# Patient Record
Sex: Female | Born: 1970 | Race: Black or African American | Hispanic: No | Marital: Married | Smoking: Never smoker
Health system: Southern US, Community
[De-identification: ages and names within clinical notes are randomized; demographics above are authoritative.]

## PROBLEM LIST (undated history)

## (undated) DIAGNOSIS — D649 Anemia, unspecified: Secondary | ICD-10-CM

## (undated) DIAGNOSIS — C801 Malignant (primary) neoplasm, unspecified: Secondary | ICD-10-CM

## (undated) HISTORY — PX: DILATION AND CURETTAGE OF UTERUS: SHX78

## (undated) HISTORY — PX: WISDOM TOOTH EXTRACTION: SHX21

## (undated) HISTORY — PX: ABDOMINAL HYSTERECTOMY: SHX81

---

## 2006-03-15 ENCOUNTER — Ambulatory Visit: Payer: Self-pay

## 2006-05-29 ENCOUNTER — Ambulatory Visit: Payer: Self-pay

## 2007-06-20 ENCOUNTER — Ambulatory Visit: Payer: Self-pay

## 2008-08-06 ENCOUNTER — Ambulatory Visit: Payer: Self-pay

## 2009-09-07 ENCOUNTER — Ambulatory Visit: Payer: Self-pay

## 2010-09-13 ENCOUNTER — Ambulatory Visit: Payer: Self-pay

## 2011-09-19 ENCOUNTER — Ambulatory Visit: Payer: Self-pay | Admitting: Obstetrics and Gynecology

## 2012-09-25 ENCOUNTER — Ambulatory Visit: Payer: Self-pay | Admitting: Obstetrics and Gynecology

## 2013-09-26 ENCOUNTER — Ambulatory Visit: Payer: Self-pay | Admitting: Obstetrics and Gynecology

## 2013-10-08 ENCOUNTER — Ambulatory Visit: Payer: Self-pay | Admitting: Obstetrics and Gynecology

## 2013-12-18 ENCOUNTER — Emergency Department: Payer: Self-pay | Admitting: Emergency Medicine

## 2014-04-06 DIAGNOSIS — N946 Dysmenorrhea, unspecified: Secondary | ICD-10-CM | POA: Insufficient documentation

## 2014-04-23 ENCOUNTER — Ambulatory Visit: Payer: Self-pay | Admitting: Internal Medicine

## 2014-04-23 ENCOUNTER — Ambulatory Visit: Payer: Self-pay | Admitting: Hematology and Oncology

## 2014-05-09 ENCOUNTER — Ambulatory Visit: Payer: Self-pay | Admitting: Internal Medicine

## 2014-05-09 ENCOUNTER — Ambulatory Visit: Payer: Self-pay | Admitting: Hematology and Oncology

## 2014-06-09 ENCOUNTER — Ambulatory Visit: Payer: Self-pay | Admitting: Hematology and Oncology

## 2014-10-07 ENCOUNTER — Ambulatory Visit: Payer: Self-pay | Admitting: Internal Medicine

## 2014-11-06 DIAGNOSIS — D251 Intramural leiomyoma of uterus: Secondary | ICD-10-CM | POA: Insufficient documentation

## 2015-07-23 ENCOUNTER — Ambulatory Visit: Payer: Self-pay | Admitting: Sports Medicine

## 2015-07-23 ENCOUNTER — Encounter: Payer: Self-pay | Admitting: Sports Medicine

## 2015-07-23 ENCOUNTER — Ambulatory Visit (INDEPENDENT_AMBULATORY_CARE_PROVIDER_SITE_OTHER): Payer: 59 | Admitting: Sports Medicine

## 2015-07-23 ENCOUNTER — Ambulatory Visit (INDEPENDENT_AMBULATORY_CARE_PROVIDER_SITE_OTHER): Payer: 59

## 2015-07-23 VITALS — BP 112/68 | HR 63 | Resp 16

## 2015-07-23 DIAGNOSIS — M216X1 Other acquired deformities of right foot: Secondary | ICD-10-CM | POA: Diagnosis not present

## 2015-07-23 DIAGNOSIS — M722 Plantar fascial fibromatosis: Secondary | ICD-10-CM

## 2015-07-23 DIAGNOSIS — M79672 Pain in left foot: Secondary | ICD-10-CM

## 2015-07-23 DIAGNOSIS — M79671 Pain in right foot: Secondary | ICD-10-CM

## 2015-07-23 DIAGNOSIS — M216X2 Other acquired deformities of left foot: Secondary | ICD-10-CM | POA: Diagnosis not present

## 2015-07-23 MED ORDER — TRIAMCINOLONE ACETONIDE 10 MG/ML IJ SUSP: 10.0000 mg | Freq: Once | INTRAMUSCULAR | Status: DC

## 2015-07-23 NOTE — Progress Notes (Signed)
Patient ID: AVYN ADEN, female   DOB: Jul 17, 1971, 44 y.o.   MRN: [id number] Subjective: Sabrina Oneill is a 44 y.o. female patient presents to office with complaint of heel pain on the right heel. 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 Admits to pain 1st few steps out of bed in the morning being the worse. Patient has treated this problem with stretching, icing and Naproxen with some relief . Patient reports that she desires more treatment because heel pain is not completely gone away; States that sometimes after work she gets jolts of pain up the leg. Patient denies any constitutional symptoms or other pedal complaints at this time.   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 There are no active problems to display for this patient.  No current outpatient prescriptions on file prior to visit.   No current facility-administered medications on file prior to visit.   No Known Allergies   ROS: Negative Objective: Physical Exam General: The patient is alert and oriented x3 in no acute distress.  Dermatology: Skin is warm, dry and supple bilateral lower extremities. Nails 1-10 are normal. There is no erythema, edema, no eccymosis, no open lesions present. Integument is otherwise unremarkable.  Vascular: Dorsalis Pedis pulse and Posterior Tibial pulse are 2/4 bilateral. Capillary fill time is immediate to all digits.  Neurological: Grossly intact to light touch with an achilles reflex of +2/5 and a  negative Tinel's sign bilateral.  Musculoskeletal: Tenderness to palpation at the medial calcaneal tubercale and through the insertion of the plantar fascia on the right foot. No pain with compression of calcaneus bilateral. No pain with tuning fork to calcaneus bilateral. No pain with calf compression bilateral. There is decreased Ankle joint range of motion bilateral. All  other joints within normal limits. Strength 5/5 in all groups  Gait: Unassisted, Antalgic avoid weight on right heel  Xray, Right foot 3 views: Posterior and inferior enthesopathy, pes planus, no fracture/dislocation. Soft tissue within normal limits  Assessment and Plan: Problem List Items Addressed This Visit    None    Visit Diagnoses    Right foot pain    -  Primary    Relevant Orders    DG Foot Complete Right    Plantar fasciitis of right foot        Acquired equinus deformity of both feet          -Complete examination performed. Discussed with patient in detail the condition of plantar fasciitis, how this occurs and general treatment options. Explained both conservative and surgical treatments.  -After oral consent and aseptic prep, injected a mixture containing 1 ml of 1%  plain lidocaine, 1 ml 0.25% plain marcaine, 0.5 ml of kenalog 10 and 0.5 ml of dexamethasone phosphate into right heel. Post-injection care discussed with patient.  -Continue with Naproxen as needed  -Recommended good supportive shoes and advised use of OTC insert. - Explained in detail the use of the fascial brace/ night splint which was dispensed at today's visit. -Explained and dispensed to patient daily stretching exercises. -Recommend patient to ice affected area 1-2x daily. -Patient to return to office in 4 weeks for follow up or sooner if problems or questions  Arise.  Landis Martins, DPM

## 2015-07-23 NOTE — Patient Instructions (Signed)
Plantar Fasciitis Plantar fasciitis is a painful foot condition that affects the heel. It occurs when the band of tissue that connects the toes to the heel bone (plantar fascia) becomes irritated. This can happen after exercising too much or doing other repetitive activities (overuse injury). The pain from plantar fasciitis can range from mild irritation to severe pain that makes it difficult for you to walk or move. The pain is usually worse in the morning or after you have been sitting or lying down for a while. CAUSES This condition may be caused by:  Standing for long periods of time.  Wearing shoes that do not fit.  Doing high-impact activities, including running, aerobics, and ballet.  Being overweight.  Having an abnormal way of walking (gait).  Having tight calf muscles.  Having high arches in your feet.  Starting a new athletic activity. SYMPTOMS The main symptom of this condition is heel pain. Other symptoms include:  Pain that gets worse after activity or exercise.  Pain that is worse in the morning or after resting.  Pain that goes away after you walk for a few minutes. DIAGNOSIS This condition may be diagnosed based on your signs and symptoms. Your health care provider will also do a physical exam to check for:  A tender area on the bottom of your foot.  A high arch in your foot.  Pain when you move your foot.  Difficulty moving your foot. You may also need to have imaging studies to confirm the diagnosis. These can include:  X-rays.  Ultrasound.  MRI. TREATMENT  Treatment for plantar fasciitis depends on the severity of the condition. Your treatment may include:  Rest, ice, and over-the-counter pain medicines to manage your pain.  Exercises to stretch your calves and your plantar fascia.  A splint that holds your foot in a stretched, upward position while you sleep (night splint).  Physical therapy to relieve symptoms and prevent problems in the  future.  Cortisone injections to relieve severe pain.  Extracorporeal shock wave therapy (ESWT) to stimulate damaged plantar fascia with electrical impulses. It is often used as a last resort before surgery.  Surgery, if other treatments have not worked after 12 months. HOME CARE INSTRUCTIONS  Take medicines only as directed by your health care provider.  Avoid activities that cause pain.  Roll the bottom of your foot over a bag of ice or a bottle of cold water. Do this for 20 minutes, 3-4 times a day.  Perform simple stretches as directed by your health care provider.  Try wearing athletic shoes with air-sole or gel-sole cushions or soft shoe inserts.  Wear a night splint while sleeping, if directed by your health care provider.  Keep all follow-up appointments with your health care provider. PREVENTION   Do not perform exercises or activities that cause heel pain.  Consider finding low-impact activities if you continue to have problems.  Lose weight if you need to. The best way to prevent plantar fasciitis is to avoid the activities that aggravate your plantar fascia. SEEK MEDICAL CARE IF:  Your symptoms do not go away after treatment with home care measures.  Your pain gets worse.  Your pain affects your ability to move or do your daily activities.   This information is not intended to replace advice given to you by your health care provider. Make sure you discuss any questions you have with your health care provider.   Document Released: 06/20/2001 Document Revised: 06/16/2015 Document Reviewed: 08/05/2014 Elsevier   Interactive Patient Education 2016 [employer]  

## 2015-08-20 ENCOUNTER — Ambulatory Visit: Payer: 59 | Admitting: Sports Medicine

## 2015-09-22 ENCOUNTER — Other Ambulatory Visit: Payer: Self-pay | Admitting: Internal Medicine

## 2015-09-22 DIAGNOSIS — Z1231 Encounter for screening mammogram for malignant neoplasm of breast: Secondary | ICD-10-CM

## 2015-10-12 ENCOUNTER — Ambulatory Visit
Admission: RE | Admit: 2015-10-12 | Discharge: 2015-10-12 | Disposition: A | Discharge status: Home / Self Care | Payer: 59 | Source: Ambulatory Visit | Attending: Internal Medicine | Admitting: Internal Medicine

## 2015-10-12 ENCOUNTER — Other Ambulatory Visit: Payer: Self-pay | Admitting: Internal Medicine

## 2015-10-12 DIAGNOSIS — Z1231 Encounter for screening mammogram for malignant neoplasm of breast: Secondary | ICD-10-CM

## 2015-11-15 DIAGNOSIS — D259 Leiomyoma of uterus, unspecified: Secondary | ICD-10-CM | POA: Diagnosis not present

## 2015-11-15 DIAGNOSIS — Z01419 Encounter for gynecological examination (general) (routine) without abnormal findings: Secondary | ICD-10-CM | POA: Diagnosis not present

## 2015-11-15 DIAGNOSIS — Z1231 Encounter for screening mammogram for malignant neoplasm of breast: Secondary | ICD-10-CM | POA: Diagnosis not present

## 2015-11-15 DIAGNOSIS — Z124 Encounter for screening for malignant neoplasm of cervix: Secondary | ICD-10-CM | POA: Diagnosis not present

## 2016-01-11 DIAGNOSIS — Z13220 Encounter for screening for lipoid disorders: Secondary | ICD-10-CM | POA: Diagnosis not present

## 2016-01-11 DIAGNOSIS — D500 Iron deficiency anemia secondary to blood loss (chronic): Secondary | ICD-10-CM | POA: Diagnosis not present

## 2016-01-11 DIAGNOSIS — Z0000 Encounter for general adult medical examination without abnormal findings: Secondary | ICD-10-CM | POA: Diagnosis not present

## 2016-07-24 DIAGNOSIS — D500 Iron deficiency anemia secondary to blood loss (chronic): Secondary | ICD-10-CM | POA: Diagnosis not present

## 2016-07-24 DIAGNOSIS — L729 Follicular cyst of the skin and subcutaneous tissue, unspecified: Secondary | ICD-10-CM | POA: Diagnosis not present

## 2016-09-11 ENCOUNTER — Other Ambulatory Visit: Payer: Self-pay | Admitting: Internal Medicine

## 2016-09-11 DIAGNOSIS — Z1231 Encounter for screening mammogram for malignant neoplasm of breast: Secondary | ICD-10-CM

## 2016-10-17 ENCOUNTER — Ambulatory Visit
Admission: RE | Admit: 2016-10-17 | Discharge: 2016-10-17 | Disposition: A | Discharge status: Home / Self Care | Payer: 59 | Source: Ambulatory Visit | Attending: Internal Medicine | Admitting: Internal Medicine

## 2016-10-17 DIAGNOSIS — Z1231 Encounter for screening mammogram for malignant neoplasm of breast: Secondary | ICD-10-CM | POA: Insufficient documentation

## 2016-11-30 DIAGNOSIS — D251 Intramural leiomyoma of uterus: Secondary | ICD-10-CM | POA: Diagnosis not present

## 2016-11-30 DIAGNOSIS — Z01411 Encounter for gynecological examination (general) (routine) with abnormal findings: Secondary | ICD-10-CM | POA: Diagnosis not present

## 2017-01-16 DIAGNOSIS — D500 Iron deficiency anemia secondary to blood loss (chronic): Secondary | ICD-10-CM | POA: Diagnosis not present

## 2017-01-16 DIAGNOSIS — L729 Follicular cyst of the skin and subcutaneous tissue, unspecified: Secondary | ICD-10-CM | POA: Diagnosis not present

## 2017-01-16 DIAGNOSIS — Z0000 Encounter for general adult medical examination without abnormal findings: Secondary | ICD-10-CM | POA: Diagnosis not present

## 2017-01-16 DIAGNOSIS — Z131 Encounter for screening for diabetes mellitus: Secondary | ICD-10-CM | POA: Diagnosis not present

## 2017-01-22 DIAGNOSIS — R220 Localized swelling, mass and lump, head: Secondary | ICD-10-CM | POA: Diagnosis not present

## 2017-01-25 DIAGNOSIS — R220 Localized swelling, mass and lump, head: Secondary | ICD-10-CM | POA: Diagnosis not present

## 2017-01-25 DIAGNOSIS — L7211 Pilar cyst: Secondary | ICD-10-CM | POA: Diagnosis not present

## 2017-02-06 DIAGNOSIS — L729 Follicular cyst of the skin and subcutaneous tissue, unspecified: Secondary | ICD-10-CM | POA: Insufficient documentation

## 2017-07-10 DIAGNOSIS — D500 Iron deficiency anemia secondary to blood loss (chronic): Secondary | ICD-10-CM | POA: Diagnosis not present

## 2017-07-11 ENCOUNTER — Other Ambulatory Visit: Payer: Self-pay | Admitting: *Deleted

## 2017-07-11 ENCOUNTER — Inpatient Hospital Stay: Payer: 59 | Attending: Oncology | Admitting: Oncology

## 2017-07-11 ENCOUNTER — Inpatient Hospital Stay: Payer: 59

## 2017-07-11 ENCOUNTER — Other Ambulatory Visit: Payer: Self-pay | Admitting: Oncology

## 2017-07-11 VITALS — BP 123/72 | HR 76 | Temp 97.9°F | Resp 18 | Wt 181.3 lb

## 2017-07-11 DIAGNOSIS — D649 Anemia, unspecified: Secondary | ICD-10-CM

## 2017-07-11 DIAGNOSIS — D500 Iron deficiency anemia secondary to blood loss (chronic): Secondary | ICD-10-CM

## 2017-07-11 DIAGNOSIS — D509 Iron deficiency anemia, unspecified: Secondary | ICD-10-CM | POA: Diagnosis not present

## 2017-07-11 LAB — RETICULOCYTES
RBC.: 3.4 MIL/uL — AB (ref 3.80–5.20)
RETIC COUNT ABSOLUTE: 85 10*3/uL (ref 19.0–183.0)
Retic Ct Pct: 2.5 % (ref 0.4–3.1)

## 2017-07-11 LAB — CBC
HEMATOCRIT: 19 % — AB (ref 35.0–47.0)
HEMOGLOBIN: 5.7 g/dL — AB (ref 12.0–16.0)
MCH: 17.5 pg — ABNORMAL LOW (ref 26.0–34.0)
MCHC: 30.1 g/dL — ABNORMAL LOW (ref 32.0–36.0)
MCV: 58.2 fL — AB (ref 80.0–100.0)
Platelets: 317 10*3/uL (ref 150–440)
RBC: 3.27 MIL/uL — AB (ref 3.80–5.20)
RDW: 24 % — AB (ref 11.5–14.5)
WBC: 4.5 10*3/uL (ref 3.6–11.0)

## 2017-07-11 LAB — FOLATE: FOLATE: 17.5 ng/mL (ref >5.9)

## 2017-07-11 LAB — IRON AND TIBC
IRON: 89 ug/dL (ref 28–170)
Saturation Ratios: 19 % (ref 10.4–31.8)
TIBC: 468 ug/dL — AB (ref 250–450)
UIBC: 379 ug/dL

## 2017-07-11 LAB — SAMPLE TO BLOOD BANK

## 2017-07-11 LAB — DAT, POLYSPECIFIC AHG (ARMC ONLY): POLYSPECIFIC AHG TEST: NEGATIVE

## 2017-07-11 LAB — FERRITIN: FERRITIN: 12 ng/mL (ref 11–307)

## 2017-07-11 LAB — VITAMIN B12: VITAMIN B 12: 649 pg/mL (ref 180–914)

## 2017-07-11 LAB — LACTATE DEHYDROGENASE: LDH: 134 U/L (ref 98–192)

## 2017-07-11 NOTE — Progress Notes (Signed)
Pt has had low hgb/hct counts.  In today as a new patient to see Dr Grayland Ormond.

## 2017-07-12 ENCOUNTER — Other Ambulatory Visit: Payer: Self-pay | Admitting: Oncology

## 2017-07-12 DIAGNOSIS — D500 Iron deficiency anemia secondary to blood loss (chronic): Secondary | ICD-10-CM

## 2017-07-12 LAB — HAPTOGLOBIN: HAPTOGLOBIN: 142 mg/dL (ref 34–200)

## 2017-07-12 LAB — ERYTHROPOIETIN: Erythropoietin: 821 m[IU]/mL — ABNORMAL HIGH (ref 2.6–18.5)

## 2017-07-13 ENCOUNTER — Inpatient Hospital Stay: Payer: 59

## 2017-07-13 NOTE — Progress Notes (Signed)
Sabrina Oneill  Telephone:(336) [phone] [phone]) [phone]  ID: Sabrina Oneill OB: [birth year]  MR#: [id number]  GEX#:[id number]  Patient Care Team: Glendon Axe, MD as PCP - General (Internal Medicine)  CHIEF COMPLAINT: Iron deficiency anemia.  INTERVAL HISTORY: Patient is a 46 year old female who has a long-standing history of severe iron deficiency anemia who is intolerant to oral iron supplementation. She previously received iron infusions, but none for over 3 years. She currently feels well and is asymptomatic. She continues to work full-time. She has no neurologic complaints. She denies any recent fevers or illnesses. She has a good appetite and denies weight loss. She has no chest pain or shortness of breath. She denies any nausea, vomiting, constipation, or diarrhea. She denies any melena or hematochezia. She continues to have heavy menses secondary to fibroids. She has no urinary complaints. Patient feels at her baseline and offers no specific complaints today.  REVIEW OF SYSTEMS:   Review of Systems  Constitutional: Negative.  Negative for fever, malaise/fatigue and weight loss.  Respiratory: Negative.  Negative for cough and shortness of breath.   Cardiovascular: Negative.  Negative for chest pain and leg swelling.  Gastrointestinal: Negative.  Negative for abdominal pain, blood in stool and melena.  Genitourinary: Negative.  Negative for hematuria.  Musculoskeletal: Negative.   Skin: Negative.   Neurological: Negative.  Negative for sensory change and weakness.  Psychiatric/Behavioral: Negative.  The patient is not nervous/anxious.     As per HPI. Otherwise, a complete review of systems is negative.  PAST MEDICAL HISTORY: No past medical history on file.  PAST SURGICAL HISTORY: No past surgical history on file.  FAMILY HISTORY: Family History  Problem Relation Age of Onset  . Breast cancer Paternal Grandmother        in her 28's    ADVANCED  DIRECTIVES (Y/N):  N  HEALTH MAINTENANCE: Social History  Substance Use Topics  . Smoking status: Never Smoker  . Smokeless tobacco: Never Used  . Alcohol use Not on file     Colonoscopy:  PAP:  Bone density:  Lipid panel:  No Known Allergies  Current Outpatient Prescriptions  Medication Sig Dispense Refill  . naproxen sodium (ANAPROX) 550 MG tablet TAKE 1 TABLET BY MOUTH 2 TIMES DAILY.    . Multiple Vitamin (MULTIVITAMIN) capsule Take by mouth.    . naproxen sodium (ANAPROX) 550 MG tablet Take by mouth.     No current facility-administered medications for this visit.     OBJECTIVE: Vitals:   07/11/17 1218  BP: 123/72  Pulse: 76  Resp: 18  Temp: 97.9 F (36.6 C)     There is no height or weight on file to calculate BMI.    ECOG FS:0 - Asymptomatic  General: Well-developed, well-nourished, no acute distress. Eyes: Pink conjunctiva, anicteric sclera. HEENT: Normocephalic, moist mucous membranes, clear oropharnyx. Lungs: Clear to auscultation bilaterally. Heart: Regular rate and rhythm. No rubs, murmurs, or gallops. Abdomen: Soft, nontender, nondistended. No organomegaly noted, normoactive bowel sounds. Musculoskeletal: No edema, cyanosis, or clubbing. Neuro: Alert, answering all questions appropriately. Cranial nerves grossly intact. Skin: No rashes or petechiae noted. Psych: Normal affect. Lymphatics: No cervical, calvicular, axillary or inguinal LAD.   LAB RESULTS:  No results found for: NA, K, CL, CO2, GLUCOSE, BUN, CREATININE, CALCIUM, PROT, ALBUMIN, AST, ALT, ALKPHOS, BILITOT, GFRNONAA, GFRAA  Lab Results  Component Value Date   WBC 4.5 07/11/2017   HGB 5.7 (L) 07/11/2017   HCT 19.0 (L) 07/11/2017   MCV 58.2 (  L) 07/11/2017   PLT 317 07/11/2017   Lab Results  Component Value Date   IRON 89 07/11/2017   TIBC 468 (H) 07/11/2017   IRONPCTSAT 19 07/11/2017   Lab Results  Component Value Date   FERRITIN 12 07/11/2017     STUDIES: No results  found.  ASSESSMENT: Iron deficiency anemia.  PLAN:    1. Iron deficiency anemia: Likely secondary to heavy menstrual bleeding from fibroids. Patient reports she is intolerant to oral iron supplementation. Previously, hemoglobinopathy profile was reported as normal. She has an inappropriately normal reticulocyte count. Her erythropoietin levels are appropriately elevated. The remainder of her laboratory work is either negative or within normal limits. Patient will proceed with 3 infusions of 510 mg Feraheme over the next 3 weeks. If her hemoglobin does not improve, could consider a bone marrow biopsy in the near future. She will return to clinic in 3 months with repeat laboratory work and further evaluation.  Approximately 60 minutes was spent in discussion of which greater than 50% was consultation.   Patient expressed understanding and was in agreement with this plan. She also understands that She can call clinic at any time with any questions, concerns, or complaints.    Lloyd Huger, MD   07/13/2017 9:34 AM

## 2017-07-16 ENCOUNTER — Inpatient Hospital Stay: Payer: 59

## 2017-07-16 VITALS — BP 109/71 | HR 75 | Temp 98.7°F | Resp 16

## 2017-07-16 DIAGNOSIS — D509 Iron deficiency anemia, unspecified: Secondary | ICD-10-CM | POA: Diagnosis not present

## 2017-07-16 DIAGNOSIS — D500 Iron deficiency anemia secondary to blood loss (chronic): Secondary | ICD-10-CM

## 2017-07-16 MED ORDER — SODIUM CHLORIDE 0.9 % IV SOLN
510.0000 mg | Freq: Once | INTRAVENOUS | Status: AC
  Administered 2017-07-16: 510 mg via INTRAVENOUS
  Filled 2017-07-16: qty 17

## 2017-07-16 MED ORDER — METHYLPREDNISOLONE SODIUM SUCC 125 MG IJ SOLR
125.0000 mg | Freq: Once | INTRAMUSCULAR | Status: AC
  Administered 2017-07-16: 125 mg via INTRAVENOUS

## 2017-07-16 MED ORDER — SODIUM CHLORIDE 0.9 % IV SOLN
Freq: Once | INTRAVENOUS | Status: AC
  Administered 2017-07-16: 14:00:00 via INTRAVENOUS
  Filled 2017-07-16: qty 1000

## 2017-07-16 MED ORDER — FAMOTIDINE IN NACL 20-0.9 MG/50ML-% IV SOLN
20.0000 mg | Freq: Once | INTRAVENOUS | Status: AC
  Administered 2017-07-16: 20 mg via INTRAVENOUS

## 2017-07-16 MED ORDER — DIPHENHYDRAMINE HCL 50 MG/ML IJ SOLN
25.0000 mg | Freq: Once | INTRAMUSCULAR | Status: AC
  Administered 2017-07-16: 25 mg via INTRAVENOUS

## 2017-07-16 NOTE — Progress Notes (Signed)
1413: Patient c/o shortness of breath and dizziness.  Infusion stopped at this time.  Dr. Grayland Ormond paged and hypersensitivity medications given.  Symptoms subsided shortly after infusion stopped.  Vitals stable.  1440:  Dr. Grayland Ormond called at this time.  Infusion restarted per MD.  Will monitor patient closely.

## 2017-07-17 DIAGNOSIS — D500 Iron deficiency anemia secondary to blood loss (chronic): Secondary | ICD-10-CM | POA: Diagnosis not present

## 2017-07-23 ENCOUNTER — Inpatient Hospital Stay: Payer: 59

## 2017-07-23 VITALS — BP 115/77 | HR 65 | Temp 96.9°F | Resp 16

## 2017-07-23 DIAGNOSIS — D500 Iron deficiency anemia secondary to blood loss (chronic): Secondary | ICD-10-CM

## 2017-07-23 DIAGNOSIS — Z299 Encounter for prophylactic measures, unspecified: Secondary | ICD-10-CM

## 2017-07-23 DIAGNOSIS — D509 Iron deficiency anemia, unspecified: Secondary | ICD-10-CM | POA: Diagnosis not present

## 2017-07-23 MED ORDER — SODIUM CHLORIDE 0.9 % IV SOLN
510.0000 mg | Freq: Once | INTRAVENOUS | Status: AC
  Administered 2017-07-23: 510 mg via INTRAVENOUS
  Filled 2017-07-23: qty 17

## 2017-07-23 MED ORDER — ACETAMINOPHEN 325 MG PO TABS
650.0000 mg | ORAL_TABLET | Freq: Once | ORAL | Status: AC
  Administered 2017-07-23: 650 mg via ORAL
  Filled 2017-07-23: qty 2

## 2017-07-23 MED ORDER — FAMOTIDINE IN NACL 20-0.9 MG/50ML-% IV SOLN
20.0000 mg | Freq: Once | INTRAVENOUS | Status: AC
  Administered 2017-07-23: 20 mg via INTRAVENOUS
  Filled 2017-07-23: qty 50

## 2017-07-23 MED ORDER — SODIUM CHLORIDE 0.9 % IV SOLN
Freq: Once | INTRAVENOUS | Status: AC
  Administered 2017-07-23: 15:00:00 via INTRAVENOUS
  Filled 2017-07-23: qty 1000

## 2017-07-23 MED ORDER — DIPHENHYDRAMINE HCL 50 MG/ML IJ SOLN
25.0000 mg | Freq: Once | INTRAMUSCULAR | Status: AC
  Administered 2017-07-23: 14:00:00 via INTRAVENOUS
  Filled 2017-07-23: qty 1

## 2017-07-23 NOTE — Progress Notes (Signed)
Feraheme infusion started at this time

## 2017-07-23 NOTE — Progress Notes (Signed)
Patient premedicated with Pepcid 20mg , Benadryl 25mg  and Tylenol 650mg .  Patient tolerated Feraheme infusion well.  No s/s of reaction noted during infusion and vitals remained stable.

## 2017-07-23 NOTE — Progress Notes (Signed)
Patient tolerated Feraheme infusion well

## 2017-07-30 ENCOUNTER — Inpatient Hospital Stay: Payer: 59

## 2017-08-13 ENCOUNTER — Inpatient Hospital Stay: Payer: 59 | Attending: Oncology

## 2017-08-13 VITALS — BP 113/71 | HR 69 | Temp 97.6°F | Resp 18

## 2017-08-13 DIAGNOSIS — D509 Iron deficiency anemia, unspecified: Secondary | ICD-10-CM | POA: Insufficient documentation

## 2017-08-13 DIAGNOSIS — Z79899 Other long term (current) drug therapy: Secondary | ICD-10-CM | POA: Diagnosis not present

## 2017-08-13 DIAGNOSIS — D500 Iron deficiency anemia secondary to blood loss (chronic): Secondary | ICD-10-CM

## 2017-08-13 DIAGNOSIS — D649 Anemia, unspecified: Secondary | ICD-10-CM

## 2017-08-13 MED ORDER — FAMOTIDINE IN NACL 20-0.9 MG/50ML-% IV SOLN
20.0000 mg | Freq: Once | INTRAVENOUS | Status: AC
  Administered 2017-08-13: 20 mg via INTRAVENOUS
  Filled 2017-08-13: qty 50

## 2017-08-13 MED ORDER — DIPHENHYDRAMINE HCL 50 MG/ML IJ SOLN
25.0000 mg | Freq: Once | INTRAMUSCULAR | Status: AC
  Administered 2017-08-13: 25 mg via INTRAVENOUS
  Filled 2017-08-13: qty 1

## 2017-08-13 MED ORDER — ACETAMINOPHEN 325 MG PO TABS
650.0000 mg | ORAL_TABLET | Freq: Once | ORAL | Status: AC
  Administered 2017-08-13: 650 mg via ORAL
  Filled 2017-08-13: qty 2

## 2017-08-13 MED ORDER — SODIUM CHLORIDE 0.9 % IV SOLN
510.0000 mg | Freq: Once | INTRAVENOUS | Status: AC
  Administered 2017-08-13: 510 mg via INTRAVENOUS
  Filled 2017-08-13: qty 17

## 2017-08-13 MED ORDER — SODIUM CHLORIDE 0.9 % IV SOLN
Freq: Once | INTRAVENOUS | Status: AC
  Administered 2017-08-13: 14:00:00 via INTRAVENOUS
  Filled 2017-08-13: qty 1000

## 2017-10-12 ENCOUNTER — Other Ambulatory Visit: Payer: Self-pay

## 2017-10-12 DIAGNOSIS — D500 Iron deficiency anemia secondary to blood loss (chronic): Secondary | ICD-10-CM

## 2017-10-15 ENCOUNTER — Inpatient Hospital Stay: Payer: 59

## 2017-10-15 ENCOUNTER — Inpatient Hospital Stay: Payer: 59 | Admitting: Oncology

## 2017-10-17 ENCOUNTER — Other Ambulatory Visit: Payer: Self-pay | Admitting: Internal Medicine

## 2017-10-17 DIAGNOSIS — Z1231 Encounter for screening mammogram for malignant neoplasm of breast: Secondary | ICD-10-CM

## 2017-10-23 ENCOUNTER — Ambulatory Visit
Admission: RE | Admit: 2017-10-23 | Discharge: 2017-10-23 | Disposition: A | Discharge status: Home / Self Care | Payer: 59 | Source: Ambulatory Visit | Attending: Internal Medicine | Admitting: Internal Medicine

## 2017-10-23 DIAGNOSIS — Z1231 Encounter for screening mammogram for malignant neoplasm of breast: Secondary | ICD-10-CM | POA: Diagnosis not present

## 2017-10-28 NOTE — Progress Notes (Deleted)
[place]  Telephone:(336) [phone] [phone]) [phone]  ID: Sabrina Oneill OB: [birth year]  MR#: [id number]  PRF#:[id number]  Patient Care Team: Glendon Axe, MD as PCP - General (Internal Medicine)  CHIEF COMPLAINT: Iron deficiency anemia.  INTERVAL HISTORY: Patient is a 47 year old female who has a long-standing history of severe iron deficiency anemia who is intolerant to oral iron supplementation. She previously received iron infusions, but none for over 3 years. She currently feels well and is asymptomatic. She continues to work full-time. She has no neurologic complaints. She denies any recent fevers or illnesses. She has a good appetite and denies weight loss. She has no chest pain or shortness of breath. She denies any nausea, vomiting, constipation, or diarrhea. She denies any melena or hematochezia. She continues to have heavy menses secondary to fibroids. She has no urinary complaints. Patient feels at her baseline and offers no specific complaints today.  REVIEW OF SYSTEMS:   Review of Systems  Constitutional: Negative.  Negative for fever, malaise/fatigue and weight loss.  Respiratory: Negative.  Negative for cough and shortness of breath.   Cardiovascular: Negative.  Negative for chest pain and leg swelling.  Gastrointestinal: Negative.  Negative for abdominal pain, blood in stool and melena.  Genitourinary: Negative.  Negative for hematuria.  Musculoskeletal: Negative.   Skin: Negative.   Neurological: Negative.  Negative for sensory change and weakness.  Psychiatric/Behavioral: Negative.  The patient is not nervous/anxious.     As per HPI. Otherwise, a complete review of systems is negative.  PAST MEDICAL HISTORY: No past medical history on file.  PAST SURGICAL HISTORY: No past surgical history on file.  FAMILY HISTORY: Family History  Problem Relation Age of Onset  . Breast cancer Paternal Grandmother        in her 21's    ADVANCED  DIRECTIVES (Y/N):  N  HEALTH MAINTENANCE: Social History   Tobacco Use  . Smoking status: Never Smoker  . Smokeless tobacco: Never Used  Substance Use Topics  . Alcohol use: Not on file  . Drug use: Not on file     Colonoscopy:  PAP:  Bone density:  Lipid panel:  No Known Allergies  Current Outpatient Medications  Medication Sig Dispense Refill  . Multiple Vitamin (MULTIVITAMIN) capsule Take by mouth.    . naproxen sodium (ANAPROX) 550 MG tablet Take by mouth.    . naproxen sodium (ANAPROX) 550 MG tablet TAKE 1 TABLET BY MOUTH 2 TIMES DAILY.     No current facility-administered medications for this visit.     OBJECTIVE: There were no vitals filed for this visit.   There is no height or weight on file to calculate BMI.    ECOG FS:0 - Asymptomatic  General: Well-developed, well-nourished, no acute distress. Eyes: Pink conjunctiva, anicteric sclera. HEENT: Normocephalic, moist mucous membranes, clear oropharnyx. Lungs: Clear to auscultation bilaterally. Heart: Regular rate and rhythm. No rubs, murmurs, or gallops. Abdomen: Soft, nontender, nondistended. No organomegaly noted, normoactive bowel sounds. Musculoskeletal: No edema, cyanosis, or clubbing. Neuro: Alert, answering all questions appropriately. Cranial nerves grossly intact. Skin: No rashes or petechiae noted. Psych: Normal affect. Lymphatics: No cervical, calvicular, axillary or inguinal LAD.   LAB RESULTS:  No results found for: NA, K, CL, CO2, GLUCOSE, BUN, CREATININE, CALCIUM, PROT, ALBUMIN, AST, ALT, ALKPHOS, BILITOT, GFRNONAA, GFRAA  Lab Results  Component Value Date   WBC 4.5 07/11/2017   HGB 5.7 (L) 07/11/2017   HCT 19.0 (L) 07/11/2017   MCV 58.2 (L) 07/11/2017  PLT 317 07/11/2017   Lab Results  Component Value Date   IRON 89 07/11/2017   TIBC 468 (H) 07/11/2017   IRONPCTSAT 19 07/11/2017   Lab Results  Component Value Date   FERRITIN 12 07/11/2017     STUDIES: Mm Screening Breast  Tomo Bilateral  Result Date: 10/23/2017 CLINICAL DATA:  Screening. EXAM: 2D DIGITAL SCREENING BILATERAL MAMMOGRAM WITH 3D TOMO WITH CAD COMPARISON:  Previous exam(s). ACR Breast Density Category c: The breast tissue is heterogeneously dense, which may obscure small masses. FINDINGS: There are no findings suspicious for malignancy. Images were processed with CAD. IMPRESSION: No mammographic evidence of malignancy. A result letter of this screening mammogram will be mailed directly to the patient. RECOMMENDATION: Screening mammogram in one year. (Code:SM-B-01Y) BI-RADS CATEGORY  1: Negative. Electronically Signed   By: Margarette Canada M.D.   On: 10/23/2017 13:19    ASSESSMENT: Iron deficiency anemia.  PLAN:    1. Iron deficiency anemia: Likely secondary to heavy menstrual bleeding from fibroids. Patient reports she is intolerant to oral iron supplementation. Previously, hemoglobinopathy profile was reported as normal. She has an inappropriately normal reticulocyte count. Her erythropoietin levels are appropriately elevated. The remainder of her laboratory work is either negative or within normal limits. Patient will proceed with 3 infusions of 510 mg Feraheme over the next 3 weeks. If her hemoglobin does not improve, could consider a bone marrow biopsy in the near future. She will return to clinic in 3 months with repeat laboratory work and further evaluation.  Approximately 60 minutes was spent in discussion of which greater than 50% was consultation.   Patient expressed understanding and was in agreement with this plan. She also understands that She can call clinic at any time with any questions, concerns, or complaints.    Lloyd Huger, MD   10/28/2017 11:29 PM

## 2017-10-29 ENCOUNTER — Inpatient Hospital Stay: Payer: 59

## 2017-10-29 ENCOUNTER — Inpatient Hospital Stay: Payer: 59 | Admitting: Oncology

## 2018-01-08 DIAGNOSIS — Z01411 Encounter for gynecological examination (general) (routine) with abnormal findings: Secondary | ICD-10-CM | POA: Diagnosis not present

## 2018-01-08 DIAGNOSIS — Z86018 Personal history of other benign neoplasm: Secondary | ICD-10-CM | POA: Diagnosis not present

## 2018-01-15 DIAGNOSIS — D500 Iron deficiency anemia secondary to blood loss (chronic): Secondary | ICD-10-CM | POA: Diagnosis not present

## 2018-01-22 DIAGNOSIS — D500 Iron deficiency anemia secondary to blood loss (chronic): Secondary | ICD-10-CM | POA: Diagnosis not present

## 2018-01-22 DIAGNOSIS — Z0000 Encounter for general adult medical examination without abnormal findings: Secondary | ICD-10-CM | POA: Diagnosis not present

## 2018-03-12 ENCOUNTER — Encounter: Payer: 59 | Attending: Internal Medicine | Admitting: Dietician

## 2018-03-12 ENCOUNTER — Encounter: Payer: Self-pay | Admitting: Dietician

## 2018-03-12 DIAGNOSIS — Z713 Dietary counseling and surveillance: Secondary | ICD-10-CM | POA: Insufficient documentation

## 2018-03-12 NOTE — Patient Instructions (Signed)
Limit ginger ale to 3 cups daily; a decrease from 4 cups daily. Decrease cranberry juice from 4 cups to 3 cups. Increase water from 4 cups to 5 cups daily. Include more salmon. Be mindful of the balance of protein, carbohydrate (starch, fruit) and non-starchy vegetables.

## 2018-03-12 NOTE — Progress Notes (Signed)
Self-Referral for Nutrition Counseling: Time with patient: 1430-1500  Diet/Weight  History: Patient reports she has been trying to limit carbs in past few weeks. She gives a highest weight of 187 lbs. Her weight today was 184.6 lbs. She has a high intake of sweetened beverages; drinks at least 1 quart of Ginger-ale daily and 1 quart of cranberry juice on 3-4 days per week. Present diet is low in fruits, vegetables and whole grains.  Typical Eating Pattern: 10:00 oatmeal or 2 eggs with butter, 4 cups coffee 1-2:00pm- Ramen noodles with toast or pizza, Ginger ale 6:00pm- baked sweet potato, beef, green beans or chicken, broccoli, sweet potatoes,Ginger ale Typically no snack after dinner  Instruction/Discussion: Used food guide plate and food models to discuss  food group servings needed to meet basic nutrition needs. Also, discussed how to better balance carbohydrate foods, protein and non-starchy vegetables.  Education/Resources: Plate planner Planning a Balanced Meal Plan: Limit ginger ale to 3 cups daily; a decrease from 4 cups daily. Decrease cranberry juice from 4 cups to 3 cups. Increase water from 4 cups to 5 cups daily. Include more salmon. Be mindful of the balance of protein, carbohydrate (starch, fruit) and non-starchy vegetables.  Follow-up:  04/02/18 at 3:00pm

## 2018-04-02 ENCOUNTER — Encounter: Payer: 59 | Admitting: Dietician

## 2018-04-02 ENCOUNTER — Encounter: Payer: Self-pay | Admitting: Dietician

## 2018-04-02 VITALS — Wt 181.2 lb

## 2018-04-02 DIAGNOSIS — Z713 Dietary counseling and surveillance: Secondary | ICD-10-CM

## 2018-04-02 NOTE — Patient Instructions (Signed)
Increase intake of fruits/vegetables to 3 servings daily. Continue with exercise of of walking 15 minutes at work and 30 minutes at home. Continue to work to increase water.

## 2018-04-02 NOTE — Progress Notes (Signed)
Self-Referral for Nutrition Counseling: 2 of 3 visits Time with patient: 1219-7588  Progress: Patient reports she has followed through to decrease Ginger ale and cranberry juice by 1 cup each. She is also including salmon 2 x per week which was a goal of hers. She has not increased water intake. She is walking at work break for 15 minutes and walks at home for 30 minutes when weather allows. Her weight today is 3.4 lbs less than her weight 3 weeks ago. Diet Recall: 9:30- oatmeal with almond milk, coffee (4 cups total) 11:30- apple with peanut butter, juice 3:00pm- frozen dinner (ww pasta and beans), juice Snack brought in by co-worker- strawberry short cake Instructions/Discussion: Commended on changes made and discussed how small changes in diet and exercise can make a difference and promote weight loss. Reviewed her present meal pattern/choices and asked her to set goals based on our discussion.  Plan: Increase intake of fruits/vegetables to 3 servings daily. Continue with exercise of of walking 15 minutes at work and 30 minutes at home. Continue to work to increase water. Follow-up: 05/07/18 at 3:00pm.

## 2018-05-07 ENCOUNTER — Ambulatory Visit: Payer: 59 | Admitting: Dietician

## 2018-05-14 ENCOUNTER — Ambulatory Visit: Payer: 59 | Admitting: Dietician

## 2018-05-27 ENCOUNTER — Encounter: Payer: 59 | Attending: Internal Medicine | Admitting: Dietician

## 2018-05-27 VITALS — Wt 183.8 lb

## 2018-05-27 DIAGNOSIS — Z713 Dietary counseling and surveillance: Secondary | ICD-10-CM | POA: Insufficient documentation

## 2018-05-27 NOTE — Patient Instructions (Signed)
Refer to calcium list with goal of 1000 mg/ day. Continue to increase water intake and limit sweetened beverages to no more than 2 cups per day. Read labels for saturated fat content with goal of no more than 12 gms daily. Limit eggs to 4 per week. Continue with goal of at least 5 servings of fruits/vegetables daily. 1/2 cup of cooked vegetables counts as a serving.

## 2018-05-27 NOTE — Progress Notes (Signed)
Self-Referral for Nutrition Counseling: 3 of 3 visits Time with patient: 1450-1520  Weight: 183.8 lbs  Progress: Patient reports that she continues to limit sweetened beverages to 2 cups daily and has increased her water intake. She is eating 3 meals daily and 2-3 snacks. She has increased her fruit intake to 1-2 servings several days per week. She is lactose intolerant and stated she does not tolerate Lactaid milk. Reviewed lipid profile with patient from 01/2018. LDL had increased from 115 to 150 since 01/2017. Instructions/Discussion: Commended on the positive diet changes made. Gave and reviewed list of foods and calcium content and instructed on daily recommendations. Reviewed food groups and minimum servings needed to meet basic nutrient needs. Instructed on recommendation for saturated fat and ways to decrease in the diet. Plan:  Refer to calcium list with goal of 1000 mg/ day. Continue to increase water intake and limit sweetened beverages to no more than 2 cups per day. Read labels for saturated fat content with goal of no more than 12 gms daily. Limit eggs to 4 per week. Continue with goal of at least 5 servings of fruits/vegetables daily. 1/2 cup of cooked vegetables counts as a serving.  Follow-up: none scheduled. Patient was encouraged to call if has further questions regarding her diet/nutrition.

## 2018-09-12 DIAGNOSIS — K5909 Other constipation: Secondary | ICD-10-CM | POA: Diagnosis not present

## 2018-10-10 ENCOUNTER — Other Ambulatory Visit: Payer: Self-pay | Admitting: Internal Medicine

## 2018-10-10 DIAGNOSIS — Z1231 Encounter for screening mammogram for malignant neoplasm of breast: Secondary | ICD-10-CM

## 2018-10-30 ENCOUNTER — Ambulatory Visit
Admission: RE | Admit: 2018-10-30 | Discharge: 2018-10-30 | Disposition: A | Discharge status: Home / Self Care | Payer: 59 | Source: Ambulatory Visit | Attending: Internal Medicine | Admitting: Internal Medicine

## 2018-10-30 DIAGNOSIS — Z1231 Encounter for screening mammogram for malignant neoplasm of breast: Secondary | ICD-10-CM | POA: Insufficient documentation

## 2018-11-04 ENCOUNTER — Other Ambulatory Visit: Payer: Self-pay | Admitting: Internal Medicine

## 2018-11-04 DIAGNOSIS — R921 Mammographic calcification found on diagnostic imaging of breast: Secondary | ICD-10-CM

## 2018-11-04 DIAGNOSIS — R928 Other abnormal and inconclusive findings on diagnostic imaging of breast: Secondary | ICD-10-CM

## 2018-11-08 ENCOUNTER — Ambulatory Visit
Admission: RE | Admit: 2018-11-08 | Discharge: 2018-11-08 | Disposition: A | Discharge status: Home / Self Care | Payer: 59 | Source: Ambulatory Visit | Attending: Internal Medicine | Admitting: Internal Medicine

## 2018-11-08 DIAGNOSIS — R921 Mammographic calcification found on diagnostic imaging of breast: Secondary | ICD-10-CM | POA: Diagnosis not present

## 2018-11-08 DIAGNOSIS — R928 Other abnormal and inconclusive findings on diagnostic imaging of breast: Secondary | ICD-10-CM

## 2018-11-12 ENCOUNTER — Other Ambulatory Visit: Payer: Self-pay | Admitting: Internal Medicine

## 2018-11-12 DIAGNOSIS — Z1231 Encounter for screening mammogram for malignant neoplasm of breast: Secondary | ICD-10-CM

## 2019-04-08 DIAGNOSIS — N852 Hypertrophy of uterus: Secondary | ICD-10-CM | POA: Diagnosis not present

## 2019-04-08 DIAGNOSIS — R921 Mammographic calcification found on diagnostic imaging of breast: Secondary | ICD-10-CM | POA: Diagnosis not present

## 2019-04-08 DIAGNOSIS — Z01419 Encounter for gynecological examination (general) (routine) without abnormal findings: Secondary | ICD-10-CM | POA: Diagnosis not present

## 2019-04-08 DIAGNOSIS — Z86018 Personal history of other benign neoplasm: Secondary | ICD-10-CM | POA: Diagnosis not present

## 2019-05-13 ENCOUNTER — Other Ambulatory Visit: Payer: Self-pay | Admitting: Obstetrics and Gynecology

## 2019-05-13 ENCOUNTER — Other Ambulatory Visit: Payer: Self-pay | Admitting: Internal Medicine

## 2019-05-13 ENCOUNTER — Ambulatory Visit
Admission: RE | Admit: 2019-05-13 | Discharge: 2019-05-13 | Disposition: A | Discharge status: Home / Self Care | Payer: 59 | Source: Ambulatory Visit | Attending: Internal Medicine | Admitting: Internal Medicine

## 2019-05-13 ENCOUNTER — Ambulatory Visit
Admission: RE | Admit: 2019-05-13 | Discharge: 2019-05-13 | Disposition: A | Discharge status: Home / Self Care | Payer: 59 | Source: Ambulatory Visit | Attending: Obstetrics and Gynecology | Admitting: Obstetrics and Gynecology

## 2019-05-13 ENCOUNTER — Other Ambulatory Visit: Payer: Self-pay

## 2019-05-13 DIAGNOSIS — Z1231 Encounter for screening mammogram for malignant neoplasm of breast: Secondary | ICD-10-CM | POA: Diagnosis not present

## 2019-05-13 DIAGNOSIS — N644 Mastodynia: Secondary | ICD-10-CM

## 2019-05-13 DIAGNOSIS — N6310 Unspecified lump in the right breast, unspecified quadrant: Secondary | ICD-10-CM

## 2019-05-13 DIAGNOSIS — R922 Inconclusive mammogram: Secondary | ICD-10-CM | POA: Diagnosis not present

## 2019-05-13 DIAGNOSIS — N6489 Other specified disorders of breast: Secondary | ICD-10-CM | POA: Diagnosis not present

## 2019-05-20 DIAGNOSIS — E7800 Pure hypercholesterolemia, unspecified: Secondary | ICD-10-CM | POA: Diagnosis not present

## 2019-05-20 DIAGNOSIS — D500 Iron deficiency anemia secondary to blood loss (chronic): Secondary | ICD-10-CM | POA: Diagnosis not present

## 2019-05-20 DIAGNOSIS — Z79899 Other long term (current) drug therapy: Secondary | ICD-10-CM | POA: Diagnosis not present

## 2019-05-20 DIAGNOSIS — Z0000 Encounter for general adult medical examination without abnormal findings: Secondary | ICD-10-CM | POA: Diagnosis not present

## 2019-05-20 DIAGNOSIS — Z131 Encounter for screening for diabetes mellitus: Secondary | ICD-10-CM | POA: Diagnosis not present

## 2019-05-23 NOTE — Progress Notes (Signed)
[place]  Telephone:(336) [phone] [phone]) [phone]  ID: Sabrina Oneill OB: 04-18-1971  MR#: [id number]  CSN#:[id number]  Patient Care Team: Derinda Late, MD as PCP - General (Family Medicine)  CHIEF COMPLAINT: Iron deficiency anemia.  INTERVAL HISTORY: Patient last evaluated in October 2018.  Patient recently was in the emergency room after being found to have a hemoglobin of 4.8.  She received 1 unit of packed red blood cells at that time.  Currently, patient states her weakness and fatigue have significantly improved.  She continues to have heavy menses and plans to discuss treatment options with OB/GYN.  She continues to remain active and work full-time. She has no neurologic complaints. She denies any recent fevers or illnesses. She has a good appetite and denies weight loss.  She denies any chest pain, shortness of breath, cough, or hemoptysis.  She denies any nausea, vomiting, constipation, or diarrhea. She denies any melena or hematochezia.  She has no urinary complaints.  Patient offers no further specific complaints today.  REVIEW OF SYSTEMS:   Review of Systems  Constitutional: Negative.  Negative for fever, malaise/fatigue and weight loss.  Respiratory: Negative.  Negative for cough and shortness of breath.   Cardiovascular: Negative.  Negative for chest pain and leg swelling.  Gastrointestinal: Negative.  Negative for abdominal pain, blood in stool and melena.  Genitourinary: Negative.  Negative for hematuria.  Musculoskeletal: Negative.  Negative for back pain.  Skin: Negative.  Negative for rash.  Neurological: Negative.  Negative for dizziness, sensory change, focal weakness and weakness.  Psychiatric/Behavioral: Negative.  The patient is not nervous/anxious.     As per HPI. Otherwise, a complete review of systems is negative.  PAST MEDICAL HISTORY: Past Medical History:  Diagnosis Date   Anemia     PAST SURGICAL HISTORY: Past Surgical  History:  Procedure Laterality Date   CESAREAN SECTION      FAMILY HISTORY: Family History  Problem Relation Age of Onset   Breast cancer Paternal Grandmother        in her 4's    ADVANCED DIRECTIVES (Y/N):  N  HEALTH MAINTENANCE: Social History   Tobacco Use   Smoking status: Never Smoker   Smokeless tobacco: Never Used  Substance Use Topics   Alcohol use: Not Currently    Alcohol/week: 0.0 standard drinks   Drug use: Not Currently     Colonoscopy:  PAP:  Bone density:  Lipid panel:  No Known Allergies  Current Outpatient Medications  Medication Sig Dispense Refill   Multiple Vitamin (MULTIVITAMIN) capsule Take 1 capsule by mouth daily.      Multiple Vitamins-Minerals (EMERGEN-C IMMUNE PO) Take 1 packet by mouth daily.     naproxen sodium (ANAPROX) 550 MG tablet Take 550 mg by mouth 2 (two) times daily as needed.      UNABLE TO FIND Take 85 mg by mouth daily. Hema-plex 85 mg     No current facility-administered medications for this visit.     OBJECTIVE: Vitals:   05/30/19 1004  BP: 110/64  Pulse: 82  Resp: 18  Temp: 99.7 F (37.6 C)     There is no height or weight on file to calculate BMI.    ECOG FS:0 - Asymptomatic  General: Well-developed, well-nourished, no acute distress. Eyes: Pink conjunctiva, anicteric sclera. HEENT: Normocephalic, moist mucous membranes, clear oropharnyx. Lungs: Clear to auscultation bilaterally. Heart: Regular rate and rhythm. No rubs, murmurs, or gallops. Abdomen: Soft, nontender, nondistended. No organomegaly noted, normoactive bowel sounds.  Musculoskeletal: No edema, cyanosis, or clubbing. Neuro: Alert, answering all questions appropriately. Cranial nerves grossly intact. Skin: No rashes or petechiae noted. Psych: Normal affect. Lymphatics: No cervical, calvicular, axillary or inguinal LAD.   LAB RESULTS:  Lab Results  Component Value Date   NA 139 05/24/2019   K 3.4 (L) 05/24/2019   CL 107 05/24/2019     CO2 23 05/24/2019   GLUCOSE 85 05/24/2019   BUN 5 (L) 05/24/2019   CREATININE 0.77 05/24/2019   CALCIUM 8.7 (L) 05/24/2019   PROT 6.7 05/24/2019   ALBUMIN 3.6 05/24/2019   AST 17 05/24/2019   ALT 13 05/24/2019   ALKPHOS 50 05/24/2019   BILITOT 0.4 05/24/2019   GFRNONAA >60 05/24/2019   GFRAA >60 05/24/2019    Lab Results  Component Value Date   WBC 4.1 05/30/2019   NEUTROABS 2.8 05/30/2019   HGB 6.9 (L) 05/30/2019   HCT 24.8 (L) 05/30/2019   MCV 68.7 (L) 05/30/2019   PLT 473 (H) 05/30/2019   Lab Results  Component Value Date   IRON 12 (L) 05/30/2019   TIBC 438 05/30/2019   IRONPCTSAT 3 (L) 05/30/2019   Lab Results  Component Value Date   FERRITIN 5 (L) 05/30/2019     STUDIES: US Breast Ltd Uni Left Inc Axilla  Result Date: 05/13/2019 CLINICAL DATA:  Six-month follow-up of probably benign right breast calcifications. New focal pain in the lateral left breast. EXAM: DIGITAL DIAGNOSTIC BILATERAL MAMMOGRAM WITH CAD AND TOMO ULTRASOUND LEFT BREAST COMPARISON:  Previous exam(s). ACR Breast Density Category c: The breast tissue is heterogeneously dense, which may obscure small masses. FINDINGS: The 2 groups of calcifications in the lateral right breast are stable. Again noted is suggestion of some layering. No other suspicious findings seen in either breast. An asymmetry in the left breast on the MLO view resolves on spot imaging. Mammographic images were processed with CAD. On physical exam, no suspicious lumps are identified. Targeted ultrasound is performed, showing no abnormalities in the region of the patient's pain. IMPRESSION: Probably benign right breast calcifications, stable. No other abnormalities identified. RECOMMENDATION: Six-month follow-up mammography of the right breast calcifications. Treatment of the patient's symptoms on the left should be based on clinical and physical exam given lack of imaging findings. I have discussed the findings and recommendations with  the patient. Results were also provided in writing at the conclusion of the visit. If applicable, a reminder letter will be sent to the patient regarding the next appointment. BI-RADS CATEGORY  3: Probably benign. Electronically Signed   By: Dorise Bullion III M.D   On: 05/13/2019 15:52   Mm Diag Breast Tomo Bilateral  Result Date: 05/13/2019 CLINICAL DATA:  Six-month follow-up of probably benign right breast calcifications. New focal pain in the lateral left breast. EXAM: DIGITAL DIAGNOSTIC BILATERAL MAMMOGRAM WITH CAD AND TOMO ULTRASOUND LEFT BREAST COMPARISON:  Previous exam(s). ACR Breast Density Category c: The breast tissue is heterogeneously dense, which may obscure small masses. FINDINGS: The 2 groups of calcifications in the lateral right breast are stable. Again noted is suggestion of some layering. No other suspicious findings seen in either breast. An asymmetry in the left breast on the MLO view resolves on spot imaging. Mammographic images were processed with CAD. On physical exam, no suspicious lumps are identified. Targeted ultrasound is performed, showing no abnormalities in the region of the patient's pain. IMPRESSION: Probably benign right breast calcifications, stable. No other abnormalities identified. RECOMMENDATION: Six-month follow-up mammography of the right breast calcifications.  Treatment of the patient's symptoms on the left should be based on clinical and physical exam given lack of imaging findings. I have discussed the findings and recommendations with the patient. Results were also provided in writing at the conclusion of the visit. If applicable, a reminder letter will be sent to the patient regarding the next appointment. BI-RADS CATEGORY  3: Probably benign. Electronically Signed   By: Dorise Bullion III M.D   On: 05/13/2019 15:52    ASSESSMENT: Iron deficiency anemia.  PLAN:    1. Iron deficiency anemia: Secondary to heavy menstrual bleeding from fibroids.  Patient is  intolerant to oral iron supplementation.  Although her hemoglobin is still significantly decreased, it is much improved since receiving 1 unit of packed red blood cells in the emergency room.  Her iron stores are also significantly reduced.  Previously, all the remainder laboratory work including hemoglobinopathy profile was either negative or within normal limits.  Return to clinic in 1 and 2 weeks for 510 mg IV Feraheme.  Patient will then return to clinic in 2 months with repeat laboratory work, further evaluation, and consideration of additional treatment.   I spent a total of 30 minutes face-to-face with the patient of which greater than 50% of the visit was spent in counseling and coordination of care as detailed above.   Patient expressed understanding and was in agreement with this plan. She also understands that She can call clinic at any time with any questions, concerns, or complaints.    Lloyd Huger, MD   05/30/2019 3:44 PM

## 2019-05-24 ENCOUNTER — Encounter: Payer: Self-pay | Admitting: Emergency Medicine

## 2019-05-24 ENCOUNTER — Emergency Department
Admission: EM | Admit: 2019-05-24 | Discharge: 2019-05-24 | Disposition: A | Discharge status: Home / Self Care | Payer: 59 | Attending: Emergency Medicine | Admitting: Emergency Medicine

## 2019-05-24 ENCOUNTER — Other Ambulatory Visit: Payer: Self-pay

## 2019-05-24 DIAGNOSIS — D649 Anemia, unspecified: Secondary | ICD-10-CM | POA: Insufficient documentation

## 2019-05-24 DIAGNOSIS — Z79899 Other long term (current) drug therapy: Secondary | ICD-10-CM | POA: Diagnosis not present

## 2019-05-24 HISTORY — DX: Anemia, unspecified: D64.9

## 2019-05-24 LAB — COMPREHENSIVE METABOLIC PANEL
ALT: 13 U/L (ref 0–44)
AST: 17 U/L (ref 15–41)
Albumin: 3.6 g/dL (ref 3.5–5.0)
Alkaline Phosphatase: 50 U/L (ref 38–126)
Anion gap: 9 (ref 5–15)
BUN: 5 mg/dL — ABNORMAL LOW (ref 6–20)
CO2: 23 mmol/L (ref 22–32)
Calcium: 8.7 mg/dL — ABNORMAL LOW (ref 8.9–10.3)
Chloride: 107 mmol/L (ref 98–111)
Creatinine, Ser: 0.77 mg/dL (ref 0.44–1.00)
GFR calc Af Amer: >60 mL/min (ref >60)
GFR calc non Af Amer: >60 mL/min (ref >60)
Glucose, Bld: 85 mg/dL (ref 70–99)
Potassium: 3.4 mmol/L — ABNORMAL LOW (ref 3.5–5.1)
Sodium: 139 mmol/L (ref 135–145)
Total Bilirubin: 0.4 mg/dL (ref 0.3–1.2)
Total Protein: 6.7 g/dL (ref 6.5–8.1)

## 2019-05-24 LAB — CBC
HCT: 18.6 % — ABNORMAL LOW (ref 36.0–46.0)
Hemoglobin: 4.8 g/dL — CL (ref 12.0–15.0)
MCH: 17.3 pg — ABNORMAL LOW (ref 26.0–34.0)
MCHC: 25.8 g/dL — ABNORMAL LOW (ref 30.0–36.0)
MCV: 67.1 fL — ABNORMAL LOW (ref 80.0–100.0)
Platelets: 266 10*3/uL (ref 150–400)
RBC: 2.77 MIL/uL — ABNORMAL LOW (ref 3.87–5.11)
RDW: 34.6 % — ABNORMAL HIGH (ref 11.5–15.5)
WBC: 4.1 10*3/uL (ref 4.0–10.5)
nRBC: 0 % (ref 0.0–0.2)

## 2019-05-24 LAB — PREPARE RBC (CROSSMATCH)

## 2019-05-24 LAB — ABO/RH: ABO/RH(D): B POS

## 2019-05-24 MED ORDER — SODIUM CHLORIDE 0.9 % IV SOLN
10.0000 mL/h | Freq: Once | INTRAVENOUS | Status: AC
Start: 2019-05-24 — End: 2019-05-24
  Administered 2019-05-24: 10 mL/h via INTRAVENOUS

## 2019-05-24 NOTE — ED Notes (Signed)
Transfusion rate change to 175 ml/hr

## 2019-05-24 NOTE — ED Provider Notes (Addendum)
[facility] Emergency Department Provider Note   ____________________________________________    I have reviewed the triage vital signs and the nursing notes.   HISTORY  Chief Complaint Abnormal Lab     HPI Sabrina Oneill is a 48 y.o. female who is sent to the emergency department for low hemoglobin.  Patient reportedly had PCP visit and was found to have hemoglobin of 4.6.  Patient has a long history of iron deficiency anemia.  Also reports a history of fibroids, recently completed menstrual cycle.  She does describe some dizziness occasionally.  In 2018 patient received iron infusions but reports that she has not been following up with hematology since then however recently she is scheduled appointment with Dr. Grayland Ormond of hematology as well as OB/GYN  Past Medical History:  Diagnosis Date  . Anemia     Patient Active Problem List   Diagnosis Date Noted  . Iron deficiency anemia due to chronic blood loss 07/12/2017    Past Surgical History:  Procedure Laterality Date  . CESAREAN SECTION      Prior to Admission medications   Medication Sig Start Date End Date Taking? Authorizing Provider  Multiple Vitamin (MULTIVITAMIN) capsule Take 1 capsule by mouth daily.    Yes [provider]  Multiple Vitamins-Minerals (EMERGEN-C IMMUNE PO) Take 1 packet by mouth daily.   Yes [provider]  naproxen sodium (ANAPROX) 550 MG tablet Take 550 mg by mouth 2 (two) times daily as needed.  07/06/17  Yes [provider]  UNABLE TO FIND Take 85 mg by mouth daily. Hema-plex 85 mg   Yes [provider]     Allergies Patient has no known allergies.  Family History  Problem Relation Age of Onset  . Breast cancer Paternal Grandmother        in her 55's    Social History Social History   Tobacco Use  . Smoking status: Never Smoker  . Smokeless tobacco: Never Used  Substance Use Topics  . Alcohol use: Not Currently    Alcohol/week: 0.0 standard drinks  . Drug use: Not Currently    Review of Systems  Constitutional: Dizziness as above Eyes: No visual changes.  ENT: No sore throat. Cardiovascular: Denies chest pain. Respiratory: Denies shortness of breath. Gastrointestinal: No abdominal pain.  No dark stools Genitourinary: Recently finished menstrual cycle Musculoskeletal: Negative for back pain. Skin: Negative for rash. Neurological: Negative for headaches    ____________________________________________   PHYSICAL EXAM:  VITAL SIGNS: ED Triage Vitals  Enc Vitals Group     BP 05/24/19 0803 128/63     Pulse Rate 05/24/19 0803 84     Resp 05/24/19 0803 18     Temp 05/24/19 0803 98.4 F (36.9 C)     Temp Source 05/24/19 0803 Oral     SpO2 05/24/19 0803 100 %     Weight --      Height --      Head Circumference --      Peak Flow --      Pain Score 05/24/19 0801 0     Pain Loc --      Pain Edu? --      Excl. in [place]? --     Constitutional: Alert and oriented.  Eyes: Conjunctival pallor Nose: No congestion/rhinnorhea. Mouth/Throat: Mucous membranes are moist.    Cardiovascular: Good peripheral circulation. Respiratory: Normal respiratory effort.  No retractions. Gastrointestinal: Soft and nontender. No distention.    Musculoskeletal:  Warm  and well perfused Neurologic:  Normal speech and language. No gross focal neurologic deficits are appreciated.  Skin:  Skin is warm, dry and intact. No rash noted. Psychiatric: Mood and affect are normal. Speech and behavior are normal.  ____________________________________________   LABS (all labs ordered are listed, but only abnormal results are displayed)  Labs Reviewed  COMPREHENSIVE METABOLIC PANEL - Abnormal; Notable for the following components:      Result Value   Potassium 3.4 (*)    BUN 5 (*)    Calcium 8.7 (*)    All other components within normal limits  CBC - Abnormal; Notable for the following components:   RBC 2.77 (*)     Hemoglobin 4.8 (*)    HCT 18.6 (*)    MCV 67.1 (*)    MCH 17.3 (*)    MCHC 25.8 (*)    RDW 34.6 (*)    All other components within normal limits  TYPE AND SCREEN  PREPARE RBC (CROSSMATCH)  ABO/RH   ____________________________________________  EKG  None ____________________________________________  RADIOLOGY  None ____________________________________________   PROCEDURES  Procedure(s) performed: No  Procedures   Critical Care performed: yes  CRITICAL CARE Performed by: Lavonia Drafts   Total critical care time: 30 minutes  Critical care time was exclusive of separately billable procedures and treating other patients.  Critical care was necessary to treat or prevent imminent or life-threatening deterioration.  Critical care was time spent personally by me on the following activities: development of treatment plan with patient and/or surrogate as well as nursing, discussions with consultants, evaluation of patient's response to treatment, examination of patient, obtaining history from patient or surrogate, ordering and performing treatments and interventions, ordering and review of laboratory studies, ordering and review of radiographic studies, pulse oximetry and re-evaluation of patient's condition.  ____________________________________________   INITIAL IMPRESSION / ASSESSMENT AND PLAN / ED COURSE  Pertinent labs & imaging results that were available during my care of the patient were reviewed by me and considered in my medical decision making (see chart for details).  Patient with outpatient hemoglobin of 4.6, review of records demonstrates in the past she has had hemoglobins as low as 5.3 during which time she was receiving iron infusions.  Discussed with Dr. Grayland Ormond who recommends 1 unit PRBC here and he will follow-up with her in his office.  She is not having any dark stools or vaginal bleeding at this time.  Consented patient for PRBC.  ----------------------------------------- 12:29 PM on 05/24/2019 -----------------------------------------   Patient has received 1 unit PRBCs, tolerated well, appropriate for discharge at this time with close follow-up with Dr. Grayland Ormond.  Return precautions discussed    ____________________________________________   FINAL CLINICAL IMPRESSION(S) / ED DIAGNOSES  Final diagnoses:  Symptomatic anemia        Note:  This document was prepared using Dragon voice recognition software and may include unintentional dictation errors.   Lavonia Drafts, MD 05/24/19 1229    Lavonia Drafts, MD 06/01/19 0630

## 2019-05-24 NOTE — ED Notes (Signed)
Pt verbalized understanding of discharge instructions. NAD at this time. 

## 2019-05-24 NOTE — ED Notes (Signed)
Signature pad not working. Hard copy printed and signed by patient.  

## 2019-05-24 NOTE — ED Notes (Signed)
Pt resting comfortable at this time. No complaints. Blood running at 295ml/hour.

## 2019-05-24 NOTE — ED Notes (Addendum)
First RN Note: Pt presents to ED via POV with c/o abnormal labs. Pt states called by PCP, states hgb 4.7. Pt states hx of anemia with hx of iron infusions. Pt ambulatory without difficulty at this time. Visualized in NAD.    Pt with document hgb in Care Everywhere on 8/11 of 4.6

## 2019-05-24 NOTE — ED Notes (Signed)
Blood transfusion rate change to 150 ml/h

## 2019-05-25 LAB — TYPE AND SCREEN
ABO/RH(D): B POS
Antibody Screen: NEGATIVE
Unit division: 0

## 2019-05-25 LAB — BPAM RBC
Blood Product Expiration Date: 202009052359
ISSUE DATE / TIME: 202008151146
Unit Type and Rh: 7300

## 2019-05-30 ENCOUNTER — Inpatient Hospital Stay: Payer: 59

## 2019-05-30 ENCOUNTER — Inpatient Hospital Stay: Payer: 59 | Attending: Oncology | Admitting: Oncology

## 2019-05-30 ENCOUNTER — Other Ambulatory Visit: Payer: Self-pay

## 2019-05-30 ENCOUNTER — Encounter: Payer: Self-pay | Admitting: Oncology

## 2019-05-30 VITALS — BP 110/64 | HR 82 | Temp 99.7°F | Resp 18 | Wt 181.0 lb

## 2019-05-30 DIAGNOSIS — D500 Iron deficiency anemia secondary to blood loss (chronic): Secondary | ICD-10-CM | POA: Insufficient documentation

## 2019-05-30 DIAGNOSIS — Z803 Family history of malignant neoplasm of breast: Secondary | ICD-10-CM | POA: Insufficient documentation

## 2019-05-30 DIAGNOSIS — D219 Benign neoplasm of connective and other soft tissue, unspecified: Secondary | ICD-10-CM | POA: Insufficient documentation

## 2019-05-30 DIAGNOSIS — N920 Excessive and frequent menstruation with regular cycle: Secondary | ICD-10-CM | POA: Diagnosis not present

## 2019-05-30 LAB — CBC WITH DIFFERENTIAL/PLATELET
Abs Immature Granulocytes: 0.01 10*3/uL (ref 0.00–0.07)
Basophils Absolute: 0 10*3/uL (ref 0.0–0.1)
Basophils Relative: 1 %
Eosinophils Absolute: 0 10*3/uL (ref 0.0–0.5)
Eosinophils Relative: 1 %
HCT: 24.8 % — ABNORMAL LOW (ref 36.0–46.0)
Hemoglobin: 6.9 g/dL — ABNORMAL LOW (ref 12.0–15.0)
Immature Granulocytes: 0 %
Lymphocytes Relative: 22 %
Lymphs Abs: 0.9 10*3/uL (ref 0.7–4.0)
MCH: 19.1 pg — ABNORMAL LOW (ref 26.0–34.0)
MCHC: 27.8 g/dL — ABNORMAL LOW (ref 30.0–36.0)
MCV: 68.7 fL — ABNORMAL LOW (ref 80.0–100.0)
Monocytes Absolute: 0.3 10*3/uL (ref 0.1–1.0)
Monocytes Relative: 7 %
Neutro Abs: 2.8 10*3/uL (ref 1.7–7.7)
Neutrophils Relative %: 69 %
Platelets: 473 10*3/uL — ABNORMAL HIGH (ref 150–400)
RBC: 3.61 MIL/uL — ABNORMAL LOW (ref 3.87–5.11)
RDW: 32.5 % — ABNORMAL HIGH (ref 11.5–15.5)
WBC: 4.1 10*3/uL (ref 4.0–10.5)
nRBC: 0 % (ref 0.0–0.2)

## 2019-05-30 LAB — VITAMIN B12: Vitamin B-12: 550 pg/mL (ref 180–914)

## 2019-05-30 LAB — FOLATE: Folate: 30 ng/mL (ref >5.9)

## 2019-05-30 LAB — IRON AND TIBC
Iron: 12 ug/dL — ABNORMAL LOW (ref 28–170)
Saturation Ratios: 3 % — ABNORMAL LOW (ref 10.4–31.8)
TIBC: 438 ug/dL (ref 250–450)
UIBC: 427 ug/dL

## 2019-05-30 LAB — SAMPLE TO BLOOD BANK

## 2019-05-30 LAB — FERRITIN: Ferritin: 5 ng/mL — ABNORMAL LOW (ref 11–307)

## 2019-05-30 LAB — LACTATE DEHYDROGENASE: LDH: 129 U/L (ref 98–192)

## 2019-05-30 NOTE — Progress Notes (Signed)
Patient last seen in 2018 for anemia and iron infusions.  Patient went ER on 05/24/19 due to low hemoglobin on labs drawn by PCP and was advised to go to ER.  She was given 1 unit of blood and this was her first transfusion.

## 2019-05-31 LAB — HAPTOGLOBIN: Haptoglobin: 149 mg/dL (ref 42–296)

## 2019-06-04 ENCOUNTER — Other Ambulatory Visit: Payer: Self-pay

## 2019-06-05 ENCOUNTER — Other Ambulatory Visit: Payer: Self-pay

## 2019-06-05 ENCOUNTER — Inpatient Hospital Stay: Payer: 59

## 2019-06-05 VITALS — BP 107/65 | HR 86 | Temp 97.6°F | Resp 18

## 2019-06-05 DIAGNOSIS — N920 Excessive and frequent menstruation with regular cycle: Secondary | ICD-10-CM | POA: Diagnosis not present

## 2019-06-05 DIAGNOSIS — Z803 Family history of malignant neoplasm of breast: Secondary | ICD-10-CM | POA: Diagnosis not present

## 2019-06-05 DIAGNOSIS — D500 Iron deficiency anemia secondary to blood loss (chronic): Secondary | ICD-10-CM | POA: Diagnosis not present

## 2019-06-05 DIAGNOSIS — D219 Benign neoplasm of connective and other soft tissue, unspecified: Secondary | ICD-10-CM | POA: Diagnosis not present

## 2019-06-05 MED ORDER — SODIUM CHLORIDE 0.9 % IV SOLN
510.0000 mg | Freq: Once | INTRAVENOUS | Status: AC
  Administered 2019-06-05: 510 mg via INTRAVENOUS
  Filled 2019-06-05: qty 17

## 2019-06-05 MED ORDER — FAMOTIDINE IN NACL 20-0.9 MG/50ML-% IV SOLN
20.0000 mg | Freq: Once | INTRAVENOUS | Status: AC
  Administered 2019-06-05: 20 mg via INTRAVENOUS
  Filled 2019-06-05: qty 50

## 2019-06-05 MED ORDER — ACETAMINOPHEN 325 MG PO TABS
650.0000 mg | ORAL_TABLET | Freq: Once | ORAL | Status: AC
  Administered 2019-06-05: 14:00:00 650 mg via ORAL
  Filled 2019-06-05: qty 2

## 2019-06-05 MED ORDER — SODIUM CHLORIDE 0.9 % IV SOLN
INTRAVENOUS | Status: DC
  Administered 2019-06-05: 14:00:00 via INTRAVENOUS
  Filled 2019-06-05: qty 250

## 2019-06-05 MED ORDER — DIPHENHYDRAMINE HCL 50 MG/ML IJ SOLN
25.0000 mg | Freq: Once | INTRAMUSCULAR | Status: AC
  Administered 2019-06-05: 25 mg via INTRAVENOUS
  Filled 2019-06-05: qty 1

## 2019-06-05 NOTE — Progress Notes (Signed)
Pt reports having a reaction to Feraheme in the past.Reviewed feraheme infusion history with Dr. Grayland Ormond. Per Dr. Grayland Ormond pt to receive 20 mg IV Pepcid, 25 mg IVP Benadryl, and 650 mg PO tylenol prior to Feraheme infusion.  Pt tolerated infusion well and monitored 30 minutes post infusion. Pt denies any complaints or concerns at this time. Pt and VS stable at discharge.

## 2019-06-06 ENCOUNTER — Ambulatory Visit: Payer: 59 | Admitting: Oncology

## 2019-06-12 ENCOUNTER — Inpatient Hospital Stay: Payer: 59 | Attending: Oncology

## 2019-06-12 ENCOUNTER — Other Ambulatory Visit: Payer: Self-pay

## 2019-06-12 ENCOUNTER — Other Ambulatory Visit: Payer: Self-pay | Admitting: Oncology

## 2019-06-12 VITALS — BP 117/74 | HR 72 | Temp 98.6°F | Resp 20

## 2019-06-12 DIAGNOSIS — D500 Iron deficiency anemia secondary to blood loss (chronic): Secondary | ICD-10-CM

## 2019-06-12 DIAGNOSIS — D509 Iron deficiency anemia, unspecified: Secondary | ICD-10-CM | POA: Diagnosis not present

## 2019-06-12 MED ORDER — FAMOTIDINE IN NACL 20-0.9 MG/50ML-% IV SOLN
20.0000 mg | Freq: Once | INTRAVENOUS | Status: AC
  Administered 2019-06-12: 14:00:00 20 mg via INTRAVENOUS
  Filled 2019-06-12: qty 50

## 2019-06-12 MED ORDER — ACETAMINOPHEN 325 MG PO TABS
650.0000 mg | ORAL_TABLET | Freq: Once | ORAL | Status: AC
  Administered 2019-06-12: 14:00:00 650 mg via ORAL
  Filled 2019-06-12: qty 2

## 2019-06-12 MED ORDER — SODIUM CHLORIDE 0.9 % IV SOLN: 200.0000 mg | Freq: Once | INTRAVENOUS | Status: DC

## 2019-06-12 MED ORDER — DIPHENHYDRAMINE HCL 50 MG/ML IJ SOLN
25.0000 mg | Freq: Once | INTRAMUSCULAR | Status: AC
  Administered 2019-06-12: 25 mg via INTRAVENOUS
  Filled 2019-06-12: qty 1

## 2019-06-12 MED ORDER — SODIUM CHLORIDE 0.9 % IV SOLN
510.0000 mg | Freq: Once | INTRAVENOUS | Status: DC
  Filled 2019-06-12: qty 17

## 2019-06-12 MED ORDER — SODIUM CHLORIDE 0.9 % IV SOLN
INTRAVENOUS | Status: DC
  Administered 2019-06-12: 14:00:00 via INTRAVENOUS
  Filled 2019-06-12: qty 250

## 2019-06-12 MED ORDER — IRON SUCROSE 20 MG/ML IV SOLN
200.0000 mg | Freq: Once | INTRAVENOUS | Status: AC
  Administered 2019-06-12: 200 mg via INTRAVENOUS
  Filled 2019-06-12: qty 10

## 2019-07-11 DIAGNOSIS — D250 Submucous leiomyoma of uterus: Secondary | ICD-10-CM | POA: Diagnosis not present

## 2019-07-11 DIAGNOSIS — D259 Leiomyoma of uterus, unspecified: Secondary | ICD-10-CM | POA: Diagnosis not present

## 2019-07-11 DIAGNOSIS — D252 Subserosal leiomyoma of uterus: Secondary | ICD-10-CM | POA: Diagnosis not present

## 2019-07-11 DIAGNOSIS — R102 Pelvic and perineal pain: Secondary | ICD-10-CM | POA: Diagnosis not present

## 2019-07-11 DIAGNOSIS — R1031 Right lower quadrant pain: Secondary | ICD-10-CM | POA: Diagnosis not present

## 2019-07-11 DIAGNOSIS — N879 Dysplasia of cervix uteri, unspecified: Secondary | ICD-10-CM | POA: Diagnosis not present

## 2019-07-11 DIAGNOSIS — D500 Iron deficiency anemia secondary to blood loss (chronic): Secondary | ICD-10-CM | POA: Diagnosis not present

## 2019-07-11 DIAGNOSIS — D251 Intramural leiomyoma of uterus: Secondary | ICD-10-CM | POA: Diagnosis not present

## 2019-07-11 DIAGNOSIS — D219 Benign neoplasm of connective and other soft tissue, unspecified: Secondary | ICD-10-CM | POA: Diagnosis not present

## 2019-07-23 ENCOUNTER — Other Ambulatory Visit: Payer: Self-pay | Admitting: Oncology

## 2019-07-24 NOTE — Progress Notes (Signed)
[place]  [phone] Fax:(336) [phone]  ID: Sabrina Oneill OB: 09/28/1971  MR#: [id number]  [id number]  Patient Care Team: Derinda Late, MD as PCP - General (Family Medicine)  CHIEF COMPLAINT: Iron deficiency anemia.  INTERVAL HISTORY: Patient returns to clinic today for repeat laboratory work and further evaluation.  She feels significantly improved after receiving 2 infusions of IV Venofer recently, but not back to her baseline.  She is actively being evaluated by OB/GYN and denies any further fibroid bleeding. She continues to remain active and work full-time. She has no neurologic complaints. She denies any recent fevers or illnesses. She has a good appetite and denies weight loss.  She denies any chest pain, shortness of breath, cough, or hemoptysis.  She denies any nausea, vomiting, constipation, or diarrhea. She denies any melena or hematochezia.  She has no urinary complaints.  Patient offers no further specific complaints today.  REVIEW OF SYSTEMS:   Review of Systems  Constitutional: Negative.  Negative for fever, malaise/fatigue and weight loss.  Respiratory: Negative.  Negative for cough and shortness of breath.   Cardiovascular: Negative.  Negative for chest pain and leg swelling.  Gastrointestinal: Negative.  Negative for abdominal pain, blood in stool and melena.  Genitourinary: Negative.  Negative for hematuria.  Musculoskeletal: Negative.  Negative for back pain.  Skin: Negative.  Negative for rash.  Neurological: Negative.  Negative for dizziness, sensory change, focal weakness and weakness.  Psychiatric/Behavioral: Negative.  The patient is not nervous/anxious.     As per HPI. Otherwise, a complete review of systems is negative.  PAST MEDICAL HISTORY: Past Medical History:  Diagnosis Date  . Anemia     PAST SURGICAL HISTORY: Past Surgical History:  Procedure Laterality Date  . CESAREAN SECTION      FAMILY  HISTORY: Family History  Problem Relation Age of Onset  . Breast cancer Paternal Grandmother        in her 87's    ADVANCED DIRECTIVES (Y/N):  N  HEALTH MAINTENANCE: Social History   Tobacco Use  . Smoking status: Never Smoker  . Smokeless tobacco: Never Used  Substance Use Topics  . Alcohol use: Not Currently    Alcohol/week: 0.0 standard drinks  . Drug use: Not Currently     Colonoscopy:  PAP:  Bone density:  Lipid panel:  No Known Allergies  Current Outpatient Medications  Medication Sig Dispense Refill  . Multiple Vitamin (MULTIVITAMIN) capsule Take 1 capsule by mouth daily.     . Multiple Vitamins-Minerals (EMERGEN-C IMMUNE PO) Take 1 packet by mouth daily.    . naproxen sodium (ANAPROX) 550 MG tablet Take 550 mg by mouth 2 (two) times daily as needed.     Marland Kitchen UNABLE TO FIND Take 85 mg by mouth daily. Hema-plex 85 mg     No current facility-administered medications for this visit.     OBJECTIVE: Vitals:   07/28/19 1329  BP: 119/66  Pulse: 71  Temp: 98.2 F (36.8 C)     There is no height or weight on file to calculate BMI.    ECOG FS:0 - Asymptomatic  General: Well-developed, well-nourished, no acute distress. Eyes: Pink conjunctiva, anicteric sclera. HEENT: Normocephalic, moist mucous membranes. Lungs: Clear to auscultation bilaterally. Heart: Regular rate and rhythm. No rubs, murmurs, or gallops. Abdomen: Soft, nontender, nondistended. No organomegaly noted, normoactive bowel sounds. Musculoskeletal: No edema, cyanosis, or clubbing. Neuro: Alert, answering all questions appropriately. Cranial nerves grossly intact. Skin: No rashes or petechiae noted. Psych:  Normal affect.  LAB RESULTS:  Lab Results  Component Value Date   NA 139 05/24/2019   K 3.4 (L) 05/24/2019   CL 107 05/24/2019   CO2 23 05/24/2019   GLUCOSE 85 05/24/2019   BUN 5 (L) 05/24/2019   CREATININE 0.77 05/24/2019   CALCIUM 8.7 (L) 05/24/2019   PROT 6.7 05/24/2019   ALBUMIN 3.6  05/24/2019   AST 17 05/24/2019   ALT 13 05/24/2019   ALKPHOS 50 05/24/2019   BILITOT 0.4 05/24/2019   GFRNONAA >60 05/24/2019   GFRAA >60 05/24/2019    Lab Results  Component Value Date   WBC 4.1 07/28/2019   NEUTROABS 2.1 07/28/2019   HGB 8.1 (L) 07/28/2019   HCT 26.9 (L) 07/28/2019   MCV 73.5 (L) 07/28/2019   PLT 207 07/28/2019   Lab Results  Component Value Date   IRON 16 (L) 07/28/2019   TIBC 412 07/28/2019   IRONPCTSAT 4 (L) 07/28/2019   Lab Results  Component Value Date   FERRITIN 6 (L) 07/28/2019     STUDIES: No results found.  ASSESSMENT: Iron deficiency anemia.  PLAN:    1. Iron deficiency anemia: Secondary to heavy menstrual bleeding from fibroids.  Patient is intolerant to oral iron supplementation.  Patient's hemoglobin remained significantly decreased at 8.1 and her iron stores are still reduced.  Previously, all the remainder laboratory work including hemoglobinopathy profile was either negative or within normal limits.   Proceed with 200 mg IV Venofer today.  Patient is out of town next week, therefore she will return to clinic in 2 and 3 weeks for additional treatment.  Patient will then return to clinic in 3 months with repeat laboratory work and further evaluation. 2.  Fibroid bleeding: Continue follow-up and treatment per OB/GYN.   I spent a total of 30 minutes face-to-face with the patient of which greater than 50% of the visit was spent in counseling and coordination of care as detailed above.   Patient expressed understanding and was in agreement with this plan. She also understands that She can call clinic at any time with any questions, concerns, or complaints.    Lloyd Huger, MD   07/29/2019 6:45 AM

## 2019-07-25 ENCOUNTER — Other Ambulatory Visit: Payer: Self-pay

## 2019-07-25 DIAGNOSIS — D500 Iron deficiency anemia secondary to blood loss (chronic): Secondary | ICD-10-CM

## 2019-07-25 NOTE — Progress Notes (Signed)
Patient pre screened for office appointment, no questions or concerns today. 

## 2019-07-28 ENCOUNTER — Inpatient Hospital Stay: Payer: 59 | Attending: Oncology

## 2019-07-28 ENCOUNTER — Inpatient Hospital Stay (HOSPITAL_BASED_OUTPATIENT_CLINIC_OR_DEPARTMENT_OTHER): Payer: 59 | Admitting: Oncology

## 2019-07-28 ENCOUNTER — Other Ambulatory Visit: Payer: Self-pay

## 2019-07-28 ENCOUNTER — Inpatient Hospital Stay: Payer: 59

## 2019-07-28 VITALS — BP 119/66 | HR 71 | Temp 98.2°F | Wt 188.3 lb

## 2019-07-28 VITALS — BP 107/69 | HR 68 | Resp 17

## 2019-07-28 DIAGNOSIS — D500 Iron deficiency anemia secondary to blood loss (chronic): Secondary | ICD-10-CM | POA: Diagnosis not present

## 2019-07-28 DIAGNOSIS — D509 Iron deficiency anemia, unspecified: Secondary | ICD-10-CM | POA: Diagnosis not present

## 2019-07-28 LAB — CBC WITH DIFFERENTIAL/PLATELET
Abs Immature Granulocytes: 0.01 10*3/uL (ref 0.00–0.07)
Basophils Absolute: 0.1 10*3/uL (ref 0.0–0.1)
Basophils Relative: 1 %
Eosinophils Absolute: 0 10*3/uL (ref 0.0–0.5)
Eosinophils Relative: 1 %
HCT: 26.9 % — ABNORMAL LOW (ref 36.0–46.0)
Hemoglobin: 8.1 g/dL — ABNORMAL LOW (ref 12.0–15.0)
Immature Granulocytes: 0 %
Lymphocytes Relative: 35 %
Lymphs Abs: 1.4 10*3/uL (ref 0.7–4.0)
MCH: 22.1 pg — ABNORMAL LOW (ref 26.0–34.0)
MCHC: 30.1 g/dL (ref 30.0–36.0)
MCV: 73.5 fL — ABNORMAL LOW (ref 80.0–100.0)
Monocytes Absolute: 0.4 10*3/uL (ref 0.1–1.0)
Monocytes Relative: 10 %
Neutro Abs: 2.1 10*3/uL (ref 1.7–7.7)
Neutrophils Relative %: 53 %
Platelets: 207 10*3/uL (ref 150–400)
RBC: 3.66 MIL/uL — ABNORMAL LOW (ref 3.87–5.11)
RDW: 25.2 % — ABNORMAL HIGH (ref 11.5–15.5)
WBC: 4.1 10*3/uL (ref 4.0–10.5)
nRBC: 0 % (ref 0.0–0.2)

## 2019-07-28 LAB — IRON AND TIBC
Iron: 16 ug/dL — ABNORMAL LOW (ref 28–170)
Saturation Ratios: 4 % — ABNORMAL LOW (ref 10.4–31.8)
TIBC: 412 ug/dL (ref 250–450)
UIBC: 396 ug/dL

## 2019-07-28 LAB — FERRITIN: Ferritin: 6 ng/mL — ABNORMAL LOW (ref 11–307)

## 2019-07-28 MED ORDER — ACETAMINOPHEN 325 MG PO TABS
650.0000 mg | ORAL_TABLET | Freq: Once | ORAL | Status: AC
  Administered 2019-07-28: 650 mg via ORAL
  Filled 2019-07-28: qty 2

## 2019-07-28 MED ORDER — SODIUM CHLORIDE 0.9 % IV SOLN: 200.0000 mg | Freq: Once | INTRAVENOUS | Status: DC

## 2019-07-28 MED ORDER — FAMOTIDINE IN NACL 20-0.9 MG/50ML-% IV SOLN
20.0000 mg | Freq: Once | INTRAVENOUS | Status: AC
  Administered 2019-07-28: 20 mg via INTRAVENOUS
  Filled 2019-07-28: qty 50

## 2019-07-28 MED ORDER — SODIUM CHLORIDE 0.9 % IV SOLN
Freq: Once | INTRAVENOUS | Status: AC
  Administered 2019-07-28: 15:00:00 via INTRAVENOUS
  Filled 2019-07-28: qty 250

## 2019-07-28 MED ORDER — DIPHENHYDRAMINE HCL 50 MG/ML IJ SOLN
25.0000 mg | Freq: Once | INTRAMUSCULAR | Status: AC
  Administered 2019-07-28: 25 mg via INTRAVENOUS
  Filled 2019-07-28: qty 1

## 2019-07-28 MED ORDER — IRON SUCROSE 20 MG/ML IV SOLN
200.0000 mg | Freq: Once | INTRAVENOUS | Status: AC
  Administered 2019-07-28: 200 mg via INTRAVENOUS
  Filled 2019-07-28: qty 10

## 2019-07-28 NOTE — Progress Notes (Signed)
Per pt she receives Tylenol 650 mg PO, Benadryl 25 mg IV, and Pepcid 20mg  IVPB prior to iron infusions. RN made MD aware, verbal order to place orders. Pt received pre medications prior to Venofer infusion. Pt updated and all questions answered at this time. VSS and stable for discharge at 1556.  Merl Guardino [employer]

## 2019-08-11 ENCOUNTER — Ambulatory Visit: Payer: 59

## 2019-08-14 ENCOUNTER — Other Ambulatory Visit: Payer: Self-pay

## 2019-08-14 ENCOUNTER — Other Ambulatory Visit: Payer: Self-pay | Admitting: Oncology

## 2019-08-14 ENCOUNTER — Inpatient Hospital Stay: Payer: 59 | Attending: Oncology

## 2019-08-14 VITALS — BP 102/63 | HR 74 | Resp 18

## 2019-08-14 DIAGNOSIS — D500 Iron deficiency anemia secondary to blood loss (chronic): Secondary | ICD-10-CM | POA: Insufficient documentation

## 2019-08-14 MED ORDER — FAMOTIDINE IN NACL 20-0.9 MG/50ML-% IV SOLN
20.0000 mg | Freq: Once | INTRAVENOUS | Status: AC
  Administered 2019-08-14: 20 mg via INTRAVENOUS
  Filled 2019-08-14: qty 50

## 2019-08-14 MED ORDER — IRON SUCROSE 20 MG/ML IV SOLN: 200.0000 mg | Freq: Once | INTRAVENOUS | Status: DC

## 2019-08-14 MED ORDER — DIPHENHYDRAMINE HCL 25 MG PO CAPS
25.0000 mg | ORAL_CAPSULE | Freq: Once | ORAL | Status: AC
  Administered 2019-08-14: 25 mg via ORAL
  Filled 2019-08-14: qty 1

## 2019-08-14 MED ORDER — SODIUM CHLORIDE 0.9 % IV SOLN: 200.0000 mg | Freq: Once | INTRAVENOUS | Status: DC

## 2019-08-14 MED ORDER — SODIUM CHLORIDE 0.9 % IV SOLN
Freq: Once | INTRAVENOUS | Status: DC
  Filled 2019-08-14: qty 250

## 2019-08-14 MED ORDER — ACETAMINOPHEN 325 MG PO TABS
650.0000 mg | ORAL_TABLET | Freq: Once | ORAL | Status: AC
  Administered 2019-08-14: 650 mg via ORAL
  Filled 2019-08-14: qty 2

## 2019-08-14 MED ORDER — IRON SUCROSE 20 MG/ML IV SOLN
200.0000 mg | Freq: Once | INTRAVENOUS | Status: AC
  Administered 2019-08-14: 200 mg via INTRAVENOUS
  Filled 2019-08-14: qty 10

## 2019-08-14 MED ORDER — SODIUM CHLORIDE 0.9 % IV SOLN
Freq: Once | INTRAVENOUS | Status: AC
  Administered 2019-08-14: 14:00:00 via INTRAVENOUS
  Filled 2019-08-14: qty 250

## 2019-08-18 ENCOUNTER — Ambulatory Visit: Payer: 59

## 2019-08-19 ENCOUNTER — Other Ambulatory Visit: Payer: Self-pay

## 2019-08-20 ENCOUNTER — Other Ambulatory Visit: Payer: Self-pay

## 2019-08-20 ENCOUNTER — Inpatient Hospital Stay: Payer: 59

## 2019-08-20 VITALS — BP 107/66 | HR 73 | Temp 98.5°F | Resp 18

## 2019-08-20 DIAGNOSIS — D252 Subserosal leiomyoma of uterus: Secondary | ICD-10-CM | POA: Diagnosis not present

## 2019-08-20 DIAGNOSIS — D500 Iron deficiency anemia secondary to blood loss (chronic): Secondary | ICD-10-CM

## 2019-08-20 DIAGNOSIS — D250 Submucous leiomyoma of uterus: Secondary | ICD-10-CM | POA: Diagnosis not present

## 2019-08-20 DIAGNOSIS — D251 Intramural leiomyoma of uterus: Secondary | ICD-10-CM | POA: Diagnosis not present

## 2019-08-20 MED ORDER — FAMOTIDINE IN NACL 20-0.9 MG/50ML-% IV SOLN
20.0000 mg | Freq: Once | INTRAVENOUS | Status: AC
  Administered 2019-08-20: 20 mg via INTRAVENOUS
  Filled 2019-08-20: qty 50

## 2019-08-20 MED ORDER — SODIUM CHLORIDE 0.9 % IV SOLN
Freq: Once | INTRAVENOUS | Status: AC
  Administered 2019-08-20: 14:00:00 via INTRAVENOUS
  Filled 2019-08-20: qty 250

## 2019-08-20 MED ORDER — SODIUM CHLORIDE 0.9 % IV SOLN: 200.0000 mg | Freq: Once | INTRAVENOUS | Status: DC

## 2019-08-20 MED ORDER — DIPHENHYDRAMINE HCL 25 MG PO CAPS
25.0000 mg | ORAL_CAPSULE | Freq: Once | ORAL | Status: AC
  Administered 2019-08-20: 25 mg via ORAL
  Filled 2019-08-20: qty 1

## 2019-08-20 MED ORDER — IRON SUCROSE 20 MG/ML IV SOLN
200.0000 mg | Freq: Once | INTRAVENOUS | Status: AC
  Administered 2019-08-20: 200 mg via INTRAVENOUS
  Filled 2019-08-20: qty 10

## 2019-08-20 MED ORDER — ACETAMINOPHEN 325 MG PO TABS
650.0000 mg | ORAL_TABLET | Freq: Once | ORAL | Status: AC
  Administered 2019-08-20: 650 mg via ORAL
  Filled 2019-08-20: qty 2

## 2019-08-21 ENCOUNTER — Ambulatory Visit: Payer: 59

## 2019-10-01 DIAGNOSIS — D251 Intramural leiomyoma of uterus: Secondary | ICD-10-CM | POA: Diagnosis not present

## 2019-10-01 DIAGNOSIS — R102 Pelvic and perineal pain: Secondary | ICD-10-CM | POA: Diagnosis not present

## 2019-10-01 DIAGNOSIS — D500 Iron deficiency anemia secondary to blood loss (chronic): Secondary | ICD-10-CM | POA: Diagnosis not present

## 2019-10-01 DIAGNOSIS — D252 Subserosal leiomyoma of uterus: Secondary | ICD-10-CM | POA: Diagnosis not present

## 2019-10-01 DIAGNOSIS — D250 Submucous leiomyoma of uterus: Secondary | ICD-10-CM | POA: Diagnosis not present

## 2019-10-08 DIAGNOSIS — D251 Intramural leiomyoma of uterus: Secondary | ICD-10-CM | POA: Diagnosis not present

## 2019-10-08 DIAGNOSIS — D250 Submucous leiomyoma of uterus: Secondary | ICD-10-CM | POA: Diagnosis not present

## 2019-10-08 DIAGNOSIS — D252 Subserosal leiomyoma of uterus: Secondary | ICD-10-CM | POA: Diagnosis not present

## 2019-10-10 HISTORY — PX: COLONOSCOPY W/ POLYPECTOMY: SHX1380

## 2019-10-24 NOTE — Progress Notes (Signed)
[place]  Telephone:(336) [phone] Fax:(336) [phone]  ID: ADENIKE TESAR OB: [birth year]  MR#: [id number]  [id number]  Patient Care Team: Derinda Late, MD as PCP - General (Family Medicine)  CHIEF COMPLAINT: Iron deficiency anemia.  INTERVAL HISTORY: Patient returns to clinic today for repeat laboratory work, further evaluation, and consideration of additional IV iron.  She continues to have weakness and fatigue, but this is improved since initiating treatment.  Patient continues to have heavy bleeding from her fibroids and has a partial hysterectomy scheduled for next month. She continues to remain active and work full-time. She has no neurologic complaints. She denies any recent fevers or illnesses. She has a good appetite and denies weight loss.  She denies any chest pain, shortness of breath, cough, or hemoptysis.  She denies any nausea, vomiting, constipation, or diarrhea. She denies any melena or hematochezia.  She has no urinary complaints.  Patient offers no further specific complaints today.  REVIEW OF SYSTEMS:   Review of Systems  Constitutional: Negative.  Negative for fever, malaise/fatigue and weight loss.  Respiratory: Negative.  Negative for cough and shortness of breath.   Cardiovascular: Negative.  Negative for chest pain and leg swelling.  Gastrointestinal: Negative.  Negative for abdominal pain, blood in stool and melena.  Genitourinary: Negative.  Negative for hematuria.  Musculoskeletal: Negative.  Negative for back pain.  Skin: Negative.  Negative for rash.  Neurological: Negative.  Negative for dizziness, sensory change, focal weakness and weakness.  Psychiatric/Behavioral: Negative.  The patient is not nervous/anxious.     As per HPI. Otherwise, a complete review of systems is negative.  PAST MEDICAL HISTORY: Past Medical History:  Diagnosis Date  . Anemia     PAST SURGICAL HISTORY: Past Surgical History:  Procedure  Laterality Date  . CESAREAN SECTION      FAMILY HISTORY: Family History  Problem Relation Age of Onset  . Breast cancer Paternal Grandmother        in her 30's    ADVANCED DIRECTIVES (Y/N):  N  HEALTH MAINTENANCE: Social History   Tobacco Use  . Smoking status: Never Smoker  . Smokeless tobacco: Never Used  Substance Use Topics  . Alcohol use: Not Currently    Alcohol/week: 0.0 standard drinks  . Drug use: Not Currently     Colonoscopy:  PAP:  Bone density:  Lipid panel:  No Known Allergies  Current Outpatient Medications  Medication Sig Dispense Refill  . Multiple Vitamin (MULTIVITAMIN) capsule Take 1 capsule by mouth daily.     . Multiple Vitamins-Minerals (EMERGEN-C IMMUNE PO) Take 1 packet by mouth daily.    . naproxen sodium (ANAPROX) 550 MG tablet Take 550 mg by mouth 2 (two) times daily as needed.     Marland Kitchen UNABLE TO FIND Take 85 mg by mouth daily. Hema-plex 85 mg     No current facility-administered medications for this visit.    OBJECTIVE: Vitals:   10/31/19 1317  BP: 115/72  Pulse: 73  Resp: 18  Temp: 98.4 F (36.9 C)     There is no height or weight on file to calculate BMI.    ECOG FS:0 - Asymptomatic  General: Well-developed, well-nourished, no acute distress. Eyes: Pink conjunctiva, anicteric sclera. HEENT: Normocephalic, moist mucous membranes. Lungs: No audible wheezing or coughing. Heart: Regular rate and rhythm. Abdomen: Soft, nontender, no obvious distention. Musculoskeletal: No edema, cyanosis, or clubbing. Neuro: Alert, answering all questions appropriately. Cranial nerves grossly intact. Skin: No rashes or petechiae noted. Psych:  Normal affect.   LAB RESULTS:  Lab Results  Component Value Date   NA 139 05/24/2019   K 3.4 (L) 05/24/2019   CL 107 05/24/2019   CO2 23 05/24/2019   GLUCOSE 85 05/24/2019   BUN 5 (L) 05/24/2019   CREATININE 0.77 05/24/2019   CALCIUM 8.7 (L) 05/24/2019   PROT 6.7 05/24/2019   ALBUMIN 3.6  05/24/2019   AST 17 05/24/2019   ALT 13 05/24/2019   ALKPHOS 50 05/24/2019   BILITOT 0.4 05/24/2019   GFRNONAA >60 05/24/2019   GFRAA >60 05/24/2019    Lab Results  Component Value Date   WBC 3.7 (L) 10/31/2019   NEUTROABS 1.9 10/31/2019   HGB 7.9 (L) 10/31/2019   HCT 28.4 (L) 10/31/2019   MCV 73.2 (L) 10/31/2019   PLT 243 10/31/2019   Lab Results  Component Value Date   IRON 11 (L) 10/31/2019   TIBC 407 10/31/2019   IRONPCTSAT 3 (L) 10/31/2019   Lab Results  Component Value Date   FERRITIN 6 (L) 10/31/2019     STUDIES: No results found.  ASSESSMENT: Iron deficiency anemia.  PLAN:    1. Iron deficiency anemia: Secondary to heavy menstrual bleeding from fibroids.  Patient is intolerant to oral iron supplementation.  Despite treatment with Venofer, patient's hemoglobin and iron stores remain significantly reduced. Previously, all the remainder laboratory work including hemoglobinopathy profile was either negative or within normal limits.  Proceed with 200 mg IV Venofer today.  Return to clinic in 1, 2, and 3 weeks for additional treatment.  Patient surgery is then scheduled for November 25, 2019.  She will return to clinic at the end of March for repeat laboratory work further evaluation and consideration of additional benefit.   2.  Fibroid bleeding: Continue follow-up and treatment per OB/GYN.  Patient reports her partial hysterectomy is scheduled for November 25, 2019.  I spent a total of 30 minutes reviewing chart data, face-to-face evaluation with the patient, counseling and coordination of care as detailed above.   Patient expressed understanding and was in agreement with this plan. She also understands that She can call clinic at any time with any questions, concerns, or complaints.    Lloyd Huger, MD   11/01/2019 6:46 AM

## 2019-10-28 ENCOUNTER — Other Ambulatory Visit: Payer: Self-pay | Admitting: Obstetrics and Gynecology

## 2019-10-28 DIAGNOSIS — R921 Mammographic calcification found on diagnostic imaging of breast: Secondary | ICD-10-CM

## 2019-10-30 ENCOUNTER — Other Ambulatory Visit: Payer: Self-pay | Admitting: *Deleted

## 2019-10-30 ENCOUNTER — Other Ambulatory Visit: Payer: Self-pay

## 2019-10-30 DIAGNOSIS — D500 Iron deficiency anemia secondary to blood loss (chronic): Secondary | ICD-10-CM

## 2019-10-30 NOTE — Progress Notes (Signed)
Patient pre screened for office appointment, no questions or concerns today. Patient reminded of upcoming appointment time and date. 

## 2019-10-31 ENCOUNTER — Other Ambulatory Visit: Payer: Self-pay

## 2019-10-31 ENCOUNTER — Inpatient Hospital Stay (HOSPITAL_BASED_OUTPATIENT_CLINIC_OR_DEPARTMENT_OTHER): Payer: 59 | Admitting: Oncology

## 2019-10-31 ENCOUNTER — Inpatient Hospital Stay: Payer: 59

## 2019-10-31 ENCOUNTER — Inpatient Hospital Stay: Payer: 59 | Attending: Oncology

## 2019-10-31 VITALS — BP 115/72 | HR 73 | Temp 98.4°F | Resp 18 | Wt 189.7 lb

## 2019-10-31 DIAGNOSIS — D500 Iron deficiency anemia secondary to blood loss (chronic): Secondary | ICD-10-CM | POA: Diagnosis not present

## 2019-10-31 DIAGNOSIS — N920 Excessive and frequent menstruation with regular cycle: Secondary | ICD-10-CM | POA: Insufficient documentation

## 2019-10-31 LAB — CBC WITH DIFFERENTIAL/PLATELET
Abs Immature Granulocytes: 0 10*3/uL (ref 0.00–0.07)
Basophils Absolute: 0 10*3/uL (ref 0.0–0.1)
Basophils Relative: 1 %
Eosinophils Absolute: 0 10*3/uL (ref 0.0–0.5)
Eosinophils Relative: 1 %
HCT: 28.4 % — ABNORMAL LOW (ref 36.0–46.0)
Hemoglobin: 7.9 g/dL — ABNORMAL LOW (ref 12.0–15.0)
Immature Granulocytes: 0 %
Lymphocytes Relative: 38 %
Lymphs Abs: 1.4 10*3/uL (ref 0.7–4.0)
MCH: 20.4 pg — ABNORMAL LOW (ref 26.0–34.0)
MCHC: 27.8 g/dL — ABNORMAL LOW (ref 30.0–36.0)
MCV: 73.2 fL — ABNORMAL LOW (ref 80.0–100.0)
Monocytes Absolute: 0.3 10*3/uL (ref 0.1–1.0)
Monocytes Relative: 9 %
Neutro Abs: 1.9 10*3/uL (ref 1.7–7.7)
Neutrophils Relative %: 51 %
Platelets: 243 10*3/uL (ref 150–400)
RBC: 3.88 MIL/uL (ref 3.87–5.11)
RDW: 18.8 % — ABNORMAL HIGH (ref 11.5–15.5)
WBC: 3.7 10*3/uL — ABNORMAL LOW (ref 4.0–10.5)
nRBC: 0 % (ref 0.0–0.2)

## 2019-10-31 LAB — FERRITIN: Ferritin: 6 ng/mL — ABNORMAL LOW (ref 11–307)

## 2019-10-31 LAB — IRON AND TIBC
Iron: 11 ug/dL — ABNORMAL LOW (ref 28–170)
Saturation Ratios: 3 % — ABNORMAL LOW (ref 10.4–31.8)
TIBC: 407 ug/dL (ref 250–450)
UIBC: 396 ug/dL

## 2019-10-31 MED ORDER — SODIUM CHLORIDE 0.9 % IV SOLN: 200.0000 mg | Freq: Once | INTRAVENOUS | Status: DC

## 2019-10-31 MED ORDER — FAMOTIDINE IN NACL 20-0.9 MG/50ML-% IV SOLN
20.0000 mg | Freq: Once | INTRAVENOUS | Status: AC
  Administered 2019-10-31: 20 mg via INTRAVENOUS
  Filled 2019-10-31: qty 50

## 2019-10-31 MED ORDER — DIPHENHYDRAMINE HCL 25 MG PO CAPS
25.0000 mg | ORAL_CAPSULE | Freq: Once | ORAL | Status: AC
  Administered 2019-10-31: 25 mg via ORAL
  Filled 2019-10-31: qty 1

## 2019-10-31 MED ORDER — SODIUM CHLORIDE 0.9 % IV SOLN
Freq: Once | INTRAVENOUS | Status: AC
  Filled 2019-10-31: qty 250

## 2019-10-31 MED ORDER — IRON SUCROSE 20 MG/ML IV SOLN
200.0000 mg | Freq: Once | INTRAVENOUS | Status: AC
  Administered 2019-10-31: 200 mg via INTRAVENOUS
  Filled 2019-10-31: qty 10

## 2019-10-31 MED ORDER — ACETAMINOPHEN 325 MG PO TABS
650.0000 mg | ORAL_TABLET | Freq: Once | ORAL | Status: AC
  Administered 2019-10-31: 650 mg via ORAL
  Filled 2019-10-31: qty 2

## 2019-11-05 DIAGNOSIS — D252 Subserosal leiomyoma of uterus: Secondary | ICD-10-CM | POA: Diagnosis not present

## 2019-11-05 DIAGNOSIS — D251 Intramural leiomyoma of uterus: Secondary | ICD-10-CM | POA: Diagnosis not present

## 2019-11-05 DIAGNOSIS — D250 Submucous leiomyoma of uterus: Secondary | ICD-10-CM | POA: Diagnosis not present

## 2019-11-07 ENCOUNTER — Inpatient Hospital Stay: Payer: 59

## 2019-11-13 ENCOUNTER — Other Ambulatory Visit: Payer: Self-pay

## 2019-11-13 ENCOUNTER — Ambulatory Visit
Admission: RE | Admit: 2019-11-13 | Discharge: 2019-11-13 | Disposition: A | Discharge status: Home / Self Care | Payer: 59 | Source: Ambulatory Visit | Attending: Obstetrics and Gynecology | Admitting: Obstetrics and Gynecology

## 2019-11-13 DIAGNOSIS — R921 Mammographic calcification found on diagnostic imaging of breast: Secondary | ICD-10-CM

## 2019-11-14 ENCOUNTER — Inpatient Hospital Stay: Payer: 59 | Attending: Oncology

## 2019-11-14 DIAGNOSIS — D509 Iron deficiency anemia, unspecified: Secondary | ICD-10-CM | POA: Insufficient documentation

## 2019-11-18 DIAGNOSIS — Z01818 Encounter for other preprocedural examination: Secondary | ICD-10-CM | POA: Diagnosis not present

## 2019-11-20 ENCOUNTER — Other Ambulatory Visit: Payer: Self-pay

## 2019-11-21 ENCOUNTER — Inpatient Hospital Stay: Payer: 59

## 2019-11-21 ENCOUNTER — Other Ambulatory Visit: Payer: Self-pay

## 2019-11-21 VITALS — BP 128/61 | HR 78 | Temp 98.1°F | Resp 18

## 2019-11-21 DIAGNOSIS — D500 Iron deficiency anemia secondary to blood loss (chronic): Secondary | ICD-10-CM

## 2019-11-21 DIAGNOSIS — D509 Iron deficiency anemia, unspecified: Secondary | ICD-10-CM | POA: Diagnosis not present

## 2019-11-21 MED ORDER — ACETAMINOPHEN 325 MG PO TABS
650.0000 mg | ORAL_TABLET | Freq: Once | ORAL | Status: AC
  Administered 2019-11-21: 14:00:00 650 mg via ORAL
  Filled 2019-11-21: qty 2

## 2019-11-21 MED ORDER — IRON SUCROSE 20 MG/ML IV SOLN
200.0000 mg | Freq: Once | INTRAVENOUS | Status: AC
  Administered 2019-11-21: 200 mg via INTRAVENOUS
  Filled 2019-11-21: qty 10

## 2019-11-21 MED ORDER — SODIUM CHLORIDE 0.9 % IV SOLN
Freq: Once | INTRAVENOUS | Status: AC
  Filled 2019-11-21: qty 250

## 2019-11-21 MED ORDER — DIPHENHYDRAMINE HCL 25 MG PO CAPS
25.0000 mg | ORAL_CAPSULE | Freq: Once | ORAL | Status: AC
  Administered 2019-11-21: 14:00:00 25 mg via ORAL
  Filled 2019-11-21: qty 1

## 2019-11-21 MED ORDER — FAMOTIDINE IN NACL 20-0.9 MG/50ML-% IV SOLN
20.0000 mg | Freq: Once | INTRAVENOUS | Status: AC
  Administered 2019-11-21: 14:00:00 20 mg via INTRAVENOUS
  Filled 2019-11-21: qty 50

## 2019-11-22 DIAGNOSIS — Z20822 Contact with and (suspected) exposure to covid-19: Secondary | ICD-10-CM | POA: Diagnosis not present

## 2019-11-22 DIAGNOSIS — Z01812 Encounter for preprocedural laboratory examination: Secondary | ICD-10-CM | POA: Diagnosis not present

## 2019-11-25 DIAGNOSIS — N72 Inflammatory disease of cervix uteri: Secondary | ICD-10-CM | POA: Diagnosis not present

## 2019-11-25 DIAGNOSIS — N838 Other noninflammatory disorders of ovary, fallopian tube and broad ligament: Secondary | ICD-10-CM | POA: Diagnosis not present

## 2019-11-25 DIAGNOSIS — D251 Intramural leiomyoma of uterus: Secondary | ICD-10-CM | POA: Diagnosis not present

## 2019-11-25 DIAGNOSIS — N841 Polyp of cervix uteri: Secondary | ICD-10-CM | POA: Diagnosis not present

## 2019-11-25 DIAGNOSIS — N800 Endometriosis of uterus: Secondary | ICD-10-CM | POA: Diagnosis not present

## 2019-11-25 DIAGNOSIS — D509 Iron deficiency anemia, unspecified: Secondary | ICD-10-CM | POA: Diagnosis not present

## 2019-11-25 DIAGNOSIS — N888 Other specified noninflammatory disorders of cervix uteri: Secondary | ICD-10-CM | POA: Diagnosis not present

## 2019-11-25 DIAGNOSIS — Z98891 History of uterine scar from previous surgery: Secondary | ICD-10-CM | POA: Diagnosis not present

## 2019-11-25 DIAGNOSIS — Z803 Family history of malignant neoplasm of breast: Secondary | ICD-10-CM | POA: Diagnosis not present

## 2019-11-25 DIAGNOSIS — D259 Leiomyoma of uterus, unspecified: Secondary | ICD-10-CM | POA: Diagnosis not present

## 2019-11-25 DIAGNOSIS — N801 Endometriosis of ovary: Secondary | ICD-10-CM | POA: Diagnosis not present

## 2019-11-25 DIAGNOSIS — M4802 Spinal stenosis, cervical region: Secondary | ICD-10-CM | POA: Diagnosis not present

## 2019-11-25 DIAGNOSIS — N920 Excessive and frequent menstruation with regular cycle: Secondary | ICD-10-CM | POA: Diagnosis not present

## 2019-11-25 DIAGNOSIS — N939 Abnormal uterine and vaginal bleeding, unspecified: Secondary | ICD-10-CM | POA: Diagnosis not present

## 2019-11-28 ENCOUNTER — Inpatient Hospital Stay: Payer: 59

## 2019-12-26 DIAGNOSIS — D500 Iron deficiency anemia secondary to blood loss (chronic): Secondary | ICD-10-CM | POA: Diagnosis not present

## 2019-12-26 DIAGNOSIS — Z1211 Encounter for screening for malignant neoplasm of colon: Secondary | ICD-10-CM | POA: Diagnosis not present

## 2019-12-26 NOTE — Progress Notes (Deleted)
[place]  Telephone:(336) [phone] Fax:(336) [phone]  ID: SCHERRI MESSERSMITH OB: Jul 28, 1971  MR#: [id number]  [id number]  Patient Care Team: Derinda Late, MD as PCP - General (Family Medicine)  CHIEF COMPLAINT: Iron deficiency anemia.  INTERVAL HISTORY: Patient returns to clinic today for repeat laboratory work, further evaluation, and consideration of additional IV iron.  She continues to have weakness and fatigue, but this is improved since initiating treatment.  Patient continues to have heavy bleeding from her fibroids and has a partial hysterectomy scheduled for next month. She continues to remain active and work full-time. She has no neurologic complaints. She denies any recent fevers or illnesses. She has a good appetite and denies weight loss.  She denies any chest pain, shortness of breath, cough, or hemoptysis.  She denies any nausea, vomiting, constipation, or diarrhea. She denies any melena or hematochezia.  She has no urinary complaints.  Patient offers no further specific complaints today.  REVIEW OF SYSTEMS:   Review of Systems  Constitutional: Negative.  Negative for fever, malaise/fatigue and weight loss.  Respiratory: Negative.  Negative for cough and shortness of breath.   Cardiovascular: Negative.  Negative for chest pain and leg swelling.  Gastrointestinal: Negative.  Negative for abdominal pain, blood in stool and melena.  Genitourinary: Negative.  Negative for hematuria.  Musculoskeletal: Negative.  Negative for back pain.  Skin: Negative.  Negative for rash.  Neurological: Negative.  Negative for dizziness, sensory change, focal weakness and weakness.  Psychiatric/Behavioral: Negative.  The patient is not nervous/anxious.     As per HPI. Otherwise, a complete review of systems is negative.  PAST MEDICAL HISTORY: Past Medical History:  Diagnosis Date  . Anemia     PAST SURGICAL HISTORY: Past Surgical History:  Procedure  Laterality Date  . CESAREAN SECTION      FAMILY HISTORY: Family History  Problem Relation Age of Onset  . Breast cancer Paternal Grandmother        in her 44's    ADVANCED DIRECTIVES (Y/N):  N  HEALTH MAINTENANCE: Social History   Tobacco Use  . Smoking status: Never Smoker  . Smokeless tobacco: Never Used  Substance Use Topics  . Alcohol use: Not Currently    Alcohol/week: 0.0 standard drinks  . Drug use: Not Currently     Colonoscopy:  PAP:  Bone density:  Lipid panel:  No Known Allergies  Current Outpatient Medications  Medication Sig Dispense Refill  . Multiple Vitamin (MULTIVITAMIN) capsule Take 1 capsule by mouth daily.     . Multiple Vitamins-Minerals (EMERGEN-C IMMUNE PO) Take 1 packet by mouth daily.    . naproxen sodium (ANAPROX) 550 MG tablet Take 550 mg by mouth 2 (two) times daily as needed.     Marland Kitchen UNABLE TO FIND Take 85 mg by mouth daily. Hema-plex 85 mg     No current facility-administered medications for this visit.    OBJECTIVE: There were no vitals filed for this visit.   There is no height or weight on file to calculate BMI.    ECOG FS:0 - Asymptomatic  General: Well-developed, well-nourished, no acute distress. Eyes: Pink conjunctiva, anicteric sclera. HEENT: Normocephalic, moist mucous membranes. Lungs: No audible wheezing or coughing. Heart: Regular rate and rhythm. Abdomen: Soft, nontender, no obvious distention. Musculoskeletal: No edema, cyanosis, or clubbing. Neuro: Alert, answering all questions appropriately. Cranial nerves grossly intact. Skin: No rashes or petechiae noted. Psych: Normal affect.   LAB RESULTS:  Lab Results  Component Value Date  NA 139 05/24/2019   K 3.4 (L) 05/24/2019   CL 107 05/24/2019   CO2 23 05/24/2019   GLUCOSE 85 05/24/2019   BUN 5 (L) 05/24/2019   CREATININE 0.77 05/24/2019   CALCIUM 8.7 (L) 05/24/2019   PROT 6.7 05/24/2019   ALBUMIN 3.6 05/24/2019   AST 17 05/24/2019   ALT 13 05/24/2019    ALKPHOS 50 05/24/2019   BILITOT 0.4 05/24/2019   GFRNONAA >60 05/24/2019   GFRAA >60 05/24/2019    Lab Results  Component Value Date   WBC 3.7 (L) 10/31/2019   NEUTROABS 1.9 10/31/2019   HGB 7.9 (L) 10/31/2019   HCT 28.4 (L) 10/31/2019   MCV 73.2 (L) 10/31/2019   PLT 243 10/31/2019   Lab Results  Component Value Date   IRON 11 (L) 10/31/2019   TIBC 407 10/31/2019   IRONPCTSAT 3 (L) 10/31/2019   Lab Results  Component Value Date   FERRITIN 6 (L) 10/31/2019     STUDIES: No results found.  ASSESSMENT: Iron deficiency anemia.  PLAN:    1. Iron deficiency anemia: Secondary to heavy menstrual bleeding from fibroids.  Patient is intolerant to oral iron supplementation.  Despite treatment with Venofer, patient's hemoglobin and iron stores remain significantly reduced. Previously, all the remainder laboratory work including hemoglobinopathy profile was either negative or within normal limits.  Proceed with 200 mg IV Venofer today.  Return to clinic in 1, 2, and 3 weeks for additional treatment.  Patient surgery is then scheduled for November 25, 2019.  She will return to clinic at the end of March for repeat laboratory work further evaluation and consideration of additional benefit.   2.  Fibroid bleeding: Continue follow-up and treatment per OB/GYN.  Patient reports her partial hysterectomy is scheduled for November 25, 2019.  I spent a total of 30 minutes reviewing chart data, face-to-face evaluation with the patient, counseling and coordination of care as detailed above.   Patient expressed understanding and was in agreement with this plan. She also understands that She can call clinic at any time with any questions, concerns, or complaints.    Lloyd Huger, MD   12/26/2019 4:17 PM

## 2019-12-30 ENCOUNTER — Telehealth: Payer: Self-pay | Admitting: Oncology

## 2019-12-30 NOTE — Telephone Encounter (Signed)
Patient phoned today and cancelled lab/MD/venofer appt for 01-02-20 as she stated that she recently had surgery and from the surgery, her hemoglobin improved. She stated that she would phone [place] and reschedule, if needed.

## 2020-01-02 ENCOUNTER — Inpatient Hospital Stay: Payer: 59 | Admitting: Oncology

## 2020-01-02 ENCOUNTER — Inpatient Hospital Stay: Payer: 59

## 2020-01-20 DIAGNOSIS — Z01812 Encounter for preprocedural laboratory examination: Secondary | ICD-10-CM | POA: Diagnosis not present

## 2020-01-20 DIAGNOSIS — D500 Iron deficiency anemia secondary to blood loss (chronic): Secondary | ICD-10-CM | POA: Diagnosis not present

## 2020-01-20 DIAGNOSIS — Z1211 Encounter for screening for malignant neoplasm of colon: Secondary | ICD-10-CM | POA: Diagnosis not present

## 2020-01-20 DIAGNOSIS — K5909 Other constipation: Secondary | ICD-10-CM | POA: Diagnosis not present

## 2020-02-16 DIAGNOSIS — Z01812 Encounter for preprocedural laboratory examination: Secondary | ICD-10-CM | POA: Diagnosis not present

## 2020-02-19 DIAGNOSIS — D123 Benign neoplasm of transverse colon: Secondary | ICD-10-CM | POA: Diagnosis not present

## 2020-02-19 DIAGNOSIS — D126 Benign neoplasm of colon, unspecified: Secondary | ICD-10-CM | POA: Diagnosis not present

## 2020-02-19 DIAGNOSIS — K648 Other hemorrhoids: Secondary | ICD-10-CM | POA: Diagnosis not present

## 2020-02-19 DIAGNOSIS — K644 Residual hemorrhoidal skin tags: Secondary | ICD-10-CM | POA: Diagnosis not present

## 2020-02-19 DIAGNOSIS — Z1211 Encounter for screening for malignant neoplasm of colon: Secondary | ICD-10-CM | POA: Diagnosis not present

## 2020-02-19 DIAGNOSIS — K635 Polyp of colon: Secondary | ICD-10-CM | POA: Diagnosis not present

## 2020-04-13 DIAGNOSIS — Z90710 Acquired absence of both cervix and uterus: Secondary | ICD-10-CM | POA: Diagnosis not present

## 2020-04-13 DIAGNOSIS — Z01419 Encounter for gynecological examination (general) (routine) without abnormal findings: Secondary | ICD-10-CM | POA: Diagnosis not present

## 2020-04-22 DIAGNOSIS — H524 Presbyopia: Secondary | ICD-10-CM | POA: Diagnosis not present

## 2020-05-20 DIAGNOSIS — Z0000 Encounter for general adult medical examination without abnormal findings: Secondary | ICD-10-CM | POA: Diagnosis not present

## 2020-05-20 DIAGNOSIS — D500 Iron deficiency anemia secondary to blood loss (chronic): Secondary | ICD-10-CM | POA: Diagnosis not present

## 2020-05-20 DIAGNOSIS — Z131 Encounter for screening for diabetes mellitus: Secondary | ICD-10-CM | POA: Diagnosis not present

## 2020-05-20 DIAGNOSIS — E7800 Pure hypercholesterolemia, unspecified: Secondary | ICD-10-CM | POA: Diagnosis not present

## 2020-11-10 ENCOUNTER — Other Ambulatory Visit: Payer: Self-pay | Admitting: Family Medicine

## 2020-11-10 DIAGNOSIS — R928 Other abnormal and inconclusive findings on diagnostic imaging of breast: Secondary | ICD-10-CM

## 2020-12-02 ENCOUNTER — Other Ambulatory Visit: Payer: Self-pay

## 2020-12-02 ENCOUNTER — Ambulatory Visit
Admission: RE | Admit: 2020-12-02 | Discharge: 2020-12-02 | Disposition: A | Discharge status: Home / Self Care | Payer: 59 | Source: Ambulatory Visit | Attending: Family Medicine | Admitting: Family Medicine

## 2020-12-02 DIAGNOSIS — R922 Inconclusive mammogram: Secondary | ICD-10-CM | POA: Diagnosis not present

## 2020-12-02 DIAGNOSIS — R921 Mammographic calcification found on diagnostic imaging of breast: Secondary | ICD-10-CM | POA: Diagnosis not present

## 2020-12-02 DIAGNOSIS — R928 Other abnormal and inconclusive findings on diagnostic imaging of breast: Secondary | ICD-10-CM | POA: Insufficient documentation

## 2021-03-16 ENCOUNTER — Other Ambulatory Visit: Payer: Self-pay

## 2021-03-16 ENCOUNTER — Encounter: Payer: Self-pay | Admitting: Oncology

## 2021-03-16 MED ORDER — CARESTART COVID-19 HOME TEST VI KIT
PACK | 0 refills | Status: AC
  Filled 2021-03-16: qty 2, 4d supply, fill #0

## 2021-03-22 ENCOUNTER — Other Ambulatory Visit: Payer: Self-pay

## 2021-03-24 ENCOUNTER — Other Ambulatory Visit: Payer: Self-pay

## 2021-03-27 IMAGING — US ULTRASOUND LEFT BREAST LIMITED
1 series · 1 of 1 positions shown · non-contrast
Comparison: Previous exam(s).

CLINICAL DATA: Six-month follow-up of probably benign right breast
calcifications. New focal pain in the lateral left breast.

EXAM:
DIGITAL DIAGNOSTIC BILATERAL MAMMOGRAM WITH CAD AND TOMO
ULTRASOUND LEFT BREAST

[Series 1: ultrasound left breast limited · 0.07mm/px · 1 of 1 slices shown]
[im 1/1]
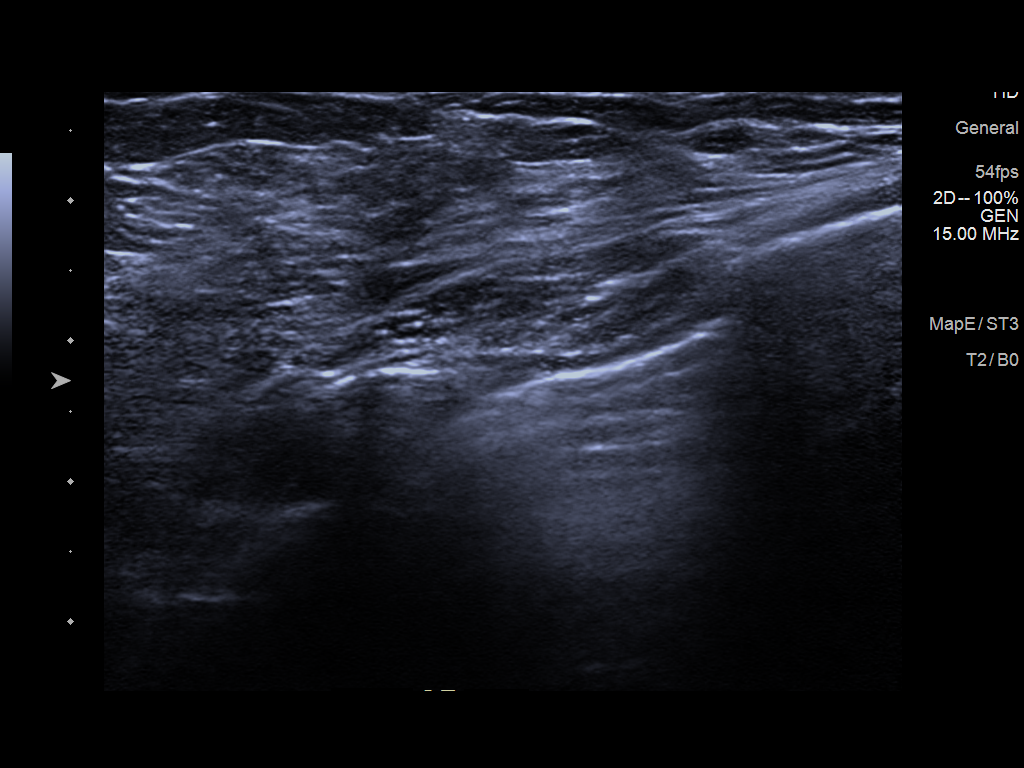

[1 of 1 positions shown; findings below may reference images not displayed]

ACR Breast Density Category c: The breast tissue is heterogeneously
dense, which may obscure small masses.
FINDINGS: The 2 groups of calcifications in the lateral right breast are
stable. Again noted is suggestion of some layering. No other
suspicious findings seen in either breast. An asymmetry in the left
breast on the MLO view resolves on spot imaging.

Mammographic images were processed with CAD.

On physical exam, no suspicious lumps are identified.

Targeted ultrasound is performed, showing no abnormalities in the
region of the patient's pain.
IMPRESSION: Probably benign right breast calcifications, stable. No other
abnormalities identified.

RECOMMENDATION:
Six-month follow-up mammography of the right breast calcifications.
Treatment of the patient's symptoms on the left should be based on
clinical and physical exam given lack of imaging findings.

I have discussed the findings and recommendations with the patient.
Results were also provided in writing at the conclusion of the
visit. If applicable, a reminder letter will be sent to the patient
regarding the next appointment.

BI-RADS CATEGORY  3: Probably benign.

## 2021-05-30 DIAGNOSIS — Z131 Encounter for screening for diabetes mellitus: Secondary | ICD-10-CM | POA: Diagnosis not present

## 2021-05-30 DIAGNOSIS — D500 Iron deficiency anemia secondary to blood loss (chronic): Secondary | ICD-10-CM | POA: Diagnosis not present

## 2021-05-30 DIAGNOSIS — E7800 Pure hypercholesterolemia, unspecified: Secondary | ICD-10-CM | POA: Diagnosis not present

## 2021-05-30 DIAGNOSIS — Z0000 Encounter for general adult medical examination without abnormal findings: Secondary | ICD-10-CM | POA: Diagnosis not present

## 2021-05-30 DIAGNOSIS — Z1331 Encounter for screening for depression: Secondary | ICD-10-CM | POA: Diagnosis not present

## 2021-11-16 ENCOUNTER — Other Ambulatory Visit: Payer: Self-pay

## 2021-11-16 ENCOUNTER — Encounter: Payer: Self-pay | Admitting: Oncology

## 2021-11-16 DIAGNOSIS — Z1231 Encounter for screening mammogram for malignant neoplasm of breast: Secondary | ICD-10-CM | POA: Diagnosis not present

## 2021-11-16 DIAGNOSIS — N951 Menopausal and female climacteric states: Secondary | ICD-10-CM | POA: Diagnosis not present

## 2021-11-16 DIAGNOSIS — Z01419 Encounter for gynecological examination (general) (routine) without abnormal findings: Secondary | ICD-10-CM | POA: Diagnosis not present

## 2021-11-16 DIAGNOSIS — Z1151 Encounter for screening for human papillomavirus (HPV): Secondary | ICD-10-CM | POA: Diagnosis not present

## 2021-11-16 MED ORDER — PREMARIN 0.625 MG/GM VA CREA
TOPICAL_CREAM | VAGINAL | 3 refills | Status: DC
  Filled 2021-11-16: qty 30, 90d supply, fill #0

## 2021-11-17 ENCOUNTER — Other Ambulatory Visit: Payer: Self-pay | Admitting: Obstetrics

## 2021-11-17 ENCOUNTER — Other Ambulatory Visit: Payer: Self-pay

## 2021-11-17 ENCOUNTER — Other Ambulatory Visit: Payer: Self-pay | Admitting: Family Medicine

## 2021-11-17 DIAGNOSIS — Z1231 Encounter for screening mammogram for malignant neoplasm of breast: Secondary | ICD-10-CM

## 2021-12-16 ENCOUNTER — Ambulatory Visit (INDEPENDENT_AMBULATORY_CARE_PROVIDER_SITE_OTHER): Payer: 59

## 2021-12-16 ENCOUNTER — Ambulatory Visit: Payer: 59 | Admitting: Podiatry

## 2021-12-16 ENCOUNTER — Other Ambulatory Visit: Payer: Self-pay

## 2021-12-16 ENCOUNTER — Encounter: Payer: Self-pay | Admitting: Oncology

## 2021-12-16 DIAGNOSIS — M722 Plantar fascial fibromatosis: Secondary | ICD-10-CM

## 2021-12-16 MED ORDER — BETAMETHASONE SOD PHOS & ACET 6 (3-3) MG/ML IJ SUSP
3.0000 mg | Freq: Once | INTRAMUSCULAR | Status: AC
  Administered 2021-12-16: 3 mg via INTRA_ARTICULAR

## 2021-12-16 MED ORDER — METHYLPREDNISOLONE 4 MG PO TBPK
ORAL_TABLET | ORAL | 0 refills | Status: DC
  Filled 2021-12-16: qty 21, 6d supply, fill #0

## 2021-12-16 MED ORDER — MELOXICAM 15 MG PO TABS
15.0000 mg | ORAL_TABLET | Freq: Every day | ORAL | 1 refills | Status: DC
  Filled 2021-12-16: qty 30, 30d supply, fill #0

## 2021-12-16 NOTE — Progress Notes (Signed)
? ?  Subjective: ?51 y.o. female presenting today as a new patient for evaluation of bilateral heel pain.  Patient was seen here in the office in 2016 for similar issues.  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  She presents for further treatment and evaluation ? ?Past Medical History:  ?Diagnosis Date  ? Anemia   ? ?Past Surgical History:  ?Procedure Laterality Date  ? CESAREAN SECTION    ? ?No Known Allergies ? ? ?Objective: ?Physical Exam ?General: The patient is alert and oriented x3 in no acute distress. ? ?Dermatology: Skin is warm, dry and supple bilateral lower extremities. Negative for open lesions or macerations bilateral.  ? ?Vascular: Dorsalis Pedis and Posterior Tibial pulses palpable bilateral.  Capillary fill time is immediate to all digits. ? ?Neurological: Epicritic and protective threshold intact bilateral.  ? ?Musculoskeletal: Tenderness to palpation to the plantar aspect of the left heel along the plantar fascia.  There is some mild tenderness to palpation as well to the right plantar heel as well as the posterior Achilles tendon area of the right lower extremity all other joints range of motion within normal limits bilateral. Strength 5/5 in all groups bilateral.  ? ?Radiographic exam: Normal osseous mineralization. Joint spaces preserved. No fracture/dislocation/boney destruction. No other soft tissue abnormalities or radiopaque foreign bodies.  Plantar and posterior heel spurs noted ? ?Assessment: ?1. Plantar fasciitis left foot ?2.  Mild plantar fasciitis and insertional Achilles tendinitis right ? ?Plan of Care:  ?1. Patient evaluated. Xrays reviewed.   ?2. Injection of 0.5cc Celestone soluspan injected into the left plantar fascia.  ?3. Rx for Medrol Dose Pak placed ?4. Rx for Meloxicam ordered for patient. ?5. Plantar fascial band(s) dispensed bilateral ?6. Instructed patient regarding therapies and modalities at home to alleviate symptoms.  ?7.   Recommend OTC arch supports available on [employer] or at [facility].  We did discuss possible custom molded orthotics but the patient seems skeptical and did not want to pursue this option ?8.  Return to clinic in 4 weeks.   ? 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 ? ?Edrick Kins, DPM ?[place] ? ?Dr. Edrick Kins, DPM  ?  ?[address]                                     ?[place], [place] [address]                ?Office [phone]  ?Fax [phone] ? ? ? ? ?

## 2022-01-05 ENCOUNTER — Ambulatory Visit
Admission: RE | Admit: 2022-01-05 | Discharge: 2022-01-05 | Disposition: A | Discharge status: Home / Self Care | Payer: 59 | Source: Ambulatory Visit | Attending: Obstetrics | Admitting: Obstetrics

## 2022-01-05 DIAGNOSIS — Z1231 Encounter for screening mammogram for malignant neoplasm of breast: Secondary | ICD-10-CM | POA: Diagnosis not present

## 2022-01-06 ENCOUNTER — Other Ambulatory Visit: Payer: Self-pay | Admitting: Obstetrics

## 2022-01-09 ENCOUNTER — Other Ambulatory Visit: Payer: Self-pay | Admitting: Obstetrics

## 2022-01-09 DIAGNOSIS — R928 Other abnormal and inconclusive findings on diagnostic imaging of breast: Secondary | ICD-10-CM

## 2022-01-24 ENCOUNTER — Ambulatory Visit (INDEPENDENT_AMBULATORY_CARE_PROVIDER_SITE_OTHER): Payer: 59 | Admitting: Podiatry

## 2022-01-24 ENCOUNTER — Other Ambulatory Visit: Payer: Self-pay

## 2022-01-24 DIAGNOSIS — M722 Plantar fascial fibromatosis: Secondary | ICD-10-CM

## 2022-01-24 MED ORDER — IBUPROFEN 800 MG PO TABS
800.0000 mg | ORAL_TABLET | Freq: Three times a day (TID) | ORAL | 1 refills | Status: AC | PRN
  Filled 2022-01-24: qty 90, 30d supply, fill #0

## 2022-01-25 ENCOUNTER — Ambulatory Visit
Admission: RE | Admit: 2022-01-25 | Discharge: 2022-01-25 | Disposition: A | Discharge status: Home / Self Care | Payer: 59 | Source: Ambulatory Visit | Attending: Obstetrics | Admitting: Obstetrics

## 2022-01-25 DIAGNOSIS — N6321 Unspecified lump in the left breast, upper outer quadrant: Secondary | ICD-10-CM | POA: Diagnosis not present

## 2022-01-25 DIAGNOSIS — R928 Other abnormal and inconclusive findings on diagnostic imaging of breast: Secondary | ICD-10-CM | POA: Insufficient documentation

## 2022-01-26 ENCOUNTER — Other Ambulatory Visit: Payer: Self-pay | Admitting: Obstetrics

## 2022-01-26 DIAGNOSIS — N63 Unspecified lump in unspecified breast: Secondary | ICD-10-CM

## 2022-01-26 DIAGNOSIS — R928 Other abnormal and inconclusive findings on diagnostic imaging of breast: Secondary | ICD-10-CM

## 2022-02-09 ENCOUNTER — Ambulatory Visit
Admission: RE | Admit: 2022-02-09 | Discharge: 2022-02-09 | Disposition: A | Discharge status: Home / Self Care | Payer: 59 | Source: Ambulatory Visit | Attending: Obstetrics | Admitting: Obstetrics

## 2022-02-09 DIAGNOSIS — R928 Other abnormal and inconclusive findings on diagnostic imaging of breast: Secondary | ICD-10-CM

## 2022-02-09 DIAGNOSIS — N6321 Unspecified lump in the left breast, upper outer quadrant: Secondary | ICD-10-CM | POA: Diagnosis not present

## 2022-02-09 DIAGNOSIS — N63 Unspecified lump in unspecified breast: Secondary | ICD-10-CM

## 2022-02-09 DIAGNOSIS — C50412 Malignant neoplasm of upper-outer quadrant of left female breast: Secondary | ICD-10-CM | POA: Diagnosis not present

## 2022-02-09 HISTORY — PX: BREAST BIOPSY: SHX20

## 2022-02-10 LAB — SURGICAL PATHOLOGY

## 2022-02-12 MED ORDER — BETAMETHASONE SOD PHOS & ACET 6 (3-3) MG/ML IJ SUSP: 3.0000 mg | Freq: Once | INTRAMUSCULAR | Status: AC

## 2022-02-12 NOTE — Progress Notes (Signed)
? ?  Subjective: ?51 y.o. female presenting today for follow-up evaluation of plantar fasciitis to the left foot as well as mild plantar fasciitis and insertional Achilles tendinitis to the right lower extremity.  Patient states that she is doing much better.  She did not take the meloxicam because of history of GI sensitivity.  She mentioned that the brace does help with the pain.  Overall improvement and she presents for further treatment and evaluation ? ?Past Medical History:  ?Diagnosis Date  ? Anemia   ? ?Past Surgical History:  ?Procedure Laterality Date  ? BREAST BIOPSY Left 02/09/2022  ? [place] bx 2:00 7cmfn heart marker, path pending  ? BREAST BIOPSY Left 02/09/2022  ? [place] bx, 1:00 10cmfn coil marker, path pending  ? CESAREAN SECTION    ? ?No Known Allergies ? ? ?Objective: ?Physical Exam ?General: The patient is alert and oriented x3 in no acute distress. ? ?Dermatology: Skin is warm, dry and supple bilateral lower extremities. Negative for open lesions or macerations bilateral.  ? ?Vascular: Dorsalis Pedis and Posterior Tibial pulses palpable bilateral.  Capillary fill time is immediate to all digits. ? ?Neurological: Epicritic and protective threshold intact bilateral.  ? ?Musculoskeletal: There continues to be some tenderness to palpation to the plantar aspect of the left heel along the plantar fascia.  There i also remains some mild tenderness to palpation as well to the right plantar heel as well as the posterior Achilles tendon area of the right lower extremity all other joints range of motion within normal limits bilateral. Strength 5/5 in all groups bilateral.  ? ?Radiographic exam bilateral feet 12/16/2021: Normal osseous mineralization. Joint spaces preserved. No fracture/dislocation/boney destruction. No other soft tissue abnormalities or radiopaque foreign bodies.  Plantar and posterior heel spurs noted ? ?Assessment: ?1. Plantar fasciitis left foot ?2.  Mild plantar fasciitis and insertional  Achilles tendinitis right ? ?Plan of Care:  ?1. Patient evaluated. Xrays reviewed.   ?2. Injection of 0.5cc Celestone soluspan injected into the left plantar fascia.  ?3.  Discontinue meloxicam.  Prescription for Motrin 800 mg since the patient is known to tolerate this ?4.  Continue plantar fascial brace ?5.  OTC arch supports were also recommended from [employer] or [facility].  The patient did not get them yet  ?6.  Return to clinic 4 weeks ? 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 ? ?Edrick Kins, DPM ?[place] ? ?Dr. Edrick Kins, DPM  ?  ?[address]                                     ?[place], [place] [address]                ?Office [phone]  ?Fax [phone] ? ? ? ? ?

## 2022-02-13 ENCOUNTER — Other Ambulatory Visit: Payer: Self-pay

## 2022-02-14 ENCOUNTER — Other Ambulatory Visit: Payer: Self-pay | Admitting: General Surgery

## 2022-02-14 ENCOUNTER — Other Ambulatory Visit: Payer: Self-pay

## 2022-02-14 DIAGNOSIS — C50412 Malignant neoplasm of upper-outer quadrant of left female breast: Secondary | ICD-10-CM

## 2022-02-14 DIAGNOSIS — Z170 Estrogen receptor positive status [ER+]: Secondary | ICD-10-CM | POA: Diagnosis not present

## 2022-02-14 MED ORDER — LIDOCAINE-PRILOCAINE 2.5-2.5 % EX CREA
TOPICAL_CREAM | CUTANEOUS | 0 refills | Status: DC
  Filled 2022-02-14: qty 30, 15d supply, fill #0

## 2022-02-15 ENCOUNTER — Other Ambulatory Visit: Payer: Self-pay

## 2022-02-15 ENCOUNTER — Other Ambulatory Visit: Payer: Self-pay | Admitting: General Surgery

## 2022-02-15 NOTE — Progress Notes (Signed)
Subjective:  ?  ? Patient ID: Sabrina Oneill is a 51 y.o. female. ?  ?HPI ?  ?The following portions of the patient's history were reviewed and updated as appropriate. ?  ?This a new patient is here today for: office visit. She is here for left breast cancer discussion. She states mammogram had been watching an area for several years. She states she does regular self breast exams and could not feel anything different. ?  ?The patient underwent hysterectomy for menorrhagia in 2021.  Prior to that she was aware of cyclic breast tenderness.  This has persisted post hysterectomy suggesting ongoing ovarian activity. ?  ?  ?Bra size: 38B ?  ?She is a Statistician at [employer]. ?  ?Review of Systems  ?Constitutional: Negative for chills and fever.  ?Respiratory: Negative for cough.   ?  ?  ?   ?Chief Complaint  ?Patient presents with  ? New Patient  ?  ?  ?BP 116/70   Pulse 70   Temp 36.9 ?C (98.5 ?F)   Ht 154.9 cm ($RemoveB'5\' 1"'rFxSUYat$ )   Wt 90.3 kg (199 lb)   LMP 05/18/2019 (Exact Date)   SpO2 98%   BMI 37.60 kg/m?  ?  ?    ?Past Medical History:  ?Diagnosis Date  ? Anemia    ? Dysmenorrhea    ? Persistent headaches since 12/2013  ?  ?  ?     ?Past Surgical History:  ?Procedure Laterality Date  ? TUBAL LIGATION   2002  ? HYSTERECTOMY   2021  ? COLONOSCOPY   02/19/2020  ?  Sessile serrated adenoma/Repeat 18yrs/[place]  ? ultrasound guided breast biopsy Left 02/09/2022  ?  2 o'clock, 7 CFN- invasive mammary carcinoma with tubular features  ? ultrasound guided breast biopsy Left 02/09/2022  ?  1 o'clock, 10 CFN- fibroadenoma  ? [place], 2002  ?  ?  ?  ?        ?OB History   ?  Gravida  ?3  ? Para  ?2  ? Term  ?2  ? Preterm  ?   ? AB  ?1  ? Living  ?2  ?  ?  SAB  ?1  ? IAB  ?   ? Ectopic  ?   ? Molar  ?   ? Multiple  ?   ? Live Births  ?2  ?  ?  ?  Obstetric Comments  ?Age at first period 9 ?Age of first pregnancy 86 ?   ?  ?   ?  ?  ?Social History  ?  ?  ?     ?Socioeconomic History  ? Marital status: Married   ? Number of children: 2  ? Years of education: 36  ?Tobacco Use  ? Smoking status: Former  ?    Years: 18.00  ?    Types: Cigarettes  ? Smokeless tobacco: Never  ?Vaping Use  ? Vaping Use: Never used  ?Substance and Sexual Activity  ? Alcohol use: Yes  ?    Comment: occasional  ? Drug use: No  ? Sexual activity: Yes  ?    Partners: Male  ?    Birth control/protection: Surgical  ?  ?  ?  ?No Known Allergies ?  ?Current Medications  ?      ?Current Outpatient Medications  ?Medication Sig Dispense Refill  ? ascorbic acid/multivit-min (EMERGEN-C ORAL) Take by mouth      ? conjugated  estrogens (PREMARIN) 0.625 mg/gram vaginal cream Place 0.5 g vaginally once daily Daily for two weeks, every other day for two weeks, then twice a week thereafter. 42.5 g 3  ? multivitamin capsule Take 1 capsule by mouth once daily.      ?  ?No current facility-administered medications for this visit.  ?  ?  ?  ?     ?Family History  ?Problem Relation Age of Onset  ? Multiple myeloma Mother    ? Diabetes type II Mother    ? No Known Problems Father    ? Breast cancer Paternal Grandmother    ?      in her 64's  ? Breast cancer Maternal Aunt 81  ?  ?  ?  ?Labs and Radiology:  ?  ?  ?Feb 09, 2022 left breast biopsy pathology: ?  ?A. BREAST, LEFT AT 2:00, 7 CM FROM NIPPLE; ULTRASOUND-GUIDED CORE NEEDLE  ?BIOPSY:  ?- INVASIVE MAMMARY CARCINOMA, WITH TUBULAR FEATURES.  ? ?Size of invasive carcinoma: 5 mm in this sample  ?Histologic grade of invasive carcinoma: Grade 1  ?                     Glandular/tubular differentiation score: 1  ?                     Nuclear pleomorphism score: 2  ?                     Mitotic rate score: 1  ?                     Total score: 4  ?Ductal carcinoma in situ: Present, low-grade  ?Lymphovascular invasion: Not identified  ? ?B. BREAST, LEFT AT 1:00, 10 CM FROM THE NIPPLE; ULTRASOUND-GUIDED CORE  ?NEEDLE BIOPSY:  ?- FRAGMENTS OF BENIGN FIBROADENOMA.  ?- NEGATIVE FOR ATYPICAL PROLIFERATIVE BREAST DISEASE.  ?CASE  SUMMARY: BREAST BIOMARKER TESTS  ?Estrogen Receptor (ER) Status: POSITIVE  ?        Percentage of cells with nuclear positivity: Greater than 90%  ?        Average intensity of staining: Strong  ? ?Progesterone Receptor (PgR) Status: POSITIVE  ?        Percentage of cells with nuclear positivity: 81-90%  ?        Average intensity of staining: Strong  ? ?HER2 (by immunohistochemistry): NEGATIVE (Score 1+)  ?Ki-67: Not performed  ?  ?Mammogram review: ?  ?May 13, 2019 through Feb 09, 2022 mammograms and ultrasounds were independently reviewed.  Radiologist recommendation of consideration of MRI noted. ?  ?Feb 14, 2022 ultrasound: ?  ?Limited examination of the left breast was completed to determine if preoperative wire localization would be required.  In the left breast at the 2 o'clock position 7 cm from the nipple an irregular taller than wide hypoechoic mass with posterior acoustic shadowing measuring 0.6 x 0.62 x 0.63 cm with a marking clip is identified.  1 prominent blood vessels associated with the lesion.  BI-RADS-6. ?  ?November 25, 2019 hysterectomy completed at [facility]: ?  ?A: Uterus, cervix, bilateral fallopian tubes, hysterectomy with bilateral salpingectomy ?  ?Uterine cervix: ?- Nabothian cysts and chronic endocervicitis. ?- Benign endocervical polyp. ?- No dysplasia or malignancy identified. ?  ?Endomyometrium: ?- Inactive endometrium; no atypia, hyperplasia or malignancy. ?- Adenomyosis. ?- Multiple leiomyomas; no atypia, increased mitotic activity or necrosis identified. ?  ?Bilateral fallopian  tubes: ?- Benign paratubal cysts. ?- No evidence of malignancy.  ?  ?Hematology note May 30, 2019: ?  ?Patient had presented with a hemoglobin of 4 and was transfused 1 unit in an 2020.  Subsequently, received iron infusions under the care of Dr. Grayland Ormond. ?  ?Review of the records showed the patient's baseline hemoglobin as far back as June 2015 was 7.1.  She apparently had been asymptomatic at that time with  regards to pain or fatigue. ?  ?May 30, 2021 laboratory: ?  ?Glucose 70 - 110 mg/dL 93   ?Sodium 136 - 145 mmol/L 138   ?Potassium 3.6 - 5.1 mmol/L 5.3 High    ?Chloride 97 - 109 mmol/L 106   ?Carbon Dioxide (CO2) 22.0 - 32.0 mmol/L 23.9   ?Urea Nitrogen (BUN) 7 - 25 mg/dL 16   ?Creatinine 0.6 - 1.1 mg/dL 1.0   ?Glomerular Filtration Rate (eGFR), MDRD Estimate >60 mL/min/1.73sq m 71   ?Calcium 8.7 - 10.3 mg/dL 9.5   ?AST  8 - 39 U/L 16   ?ALT  5 - 38 U/L 10   ?Alk Phos (alkaline Phosphatase) 34 - 104 U/L 52   ?Albumin 3.5 - 4.8 g/dL 4.2   ?Bilirubin, Total 0.3 - 1.2 mg/dL 0.4   ?Protein, Total 6.1 - 7.9 g/dL 7.2   ?A/G Ratio 1.0 - 5.0 gm/dL 1.4   ?  ?WBC (White Blood Cell Count) 4.1 - 10.2 10?3/uL 6.5   ?RBC (Red Blood Cell Count) 4.04 - 5.48 10?6/uL 4.57   ?Hemoglobin 12.0 - 15.0 gm/dL 13.1   ?Hematocrit 35.0 - 47.0 % 40.5   ?MCV (Mean Corpuscular Volume) 80.0 - 100.0 fl 88.6   ?MCH (Mean Corpuscular Hemoglobin) 27.0 - 31.2 pg 28.7   ?MCHC (Mean Corpuscular Hemoglobin Concentration) 32.0 - 36.0 gm/dL 32.3   ?Platelet Count 150 - 450 10?3/uL 285   ?RDW-CV (Red Cell Distribution Width) 11.6 - 14.8 % 12.4   ?MPV (Mean Platelet Volume) 9.4 - 12.4 fl 8.1 Low    ?Neutrophils 1.50 - 7.80 10?3/uL 4.20   ?Lymphocytes 1.00 - 3.60 10?3/uL 1.80   ?Mixed Count 0.10 - 0.90 10?3/uL 0.50   ?Neutrophil % 32.0 - 70.0 % 64.4   ?Lymphocyte % 10.0 - 50.0 % 27.2   ?Mixed % 3.0 - 14.4 % 8.4   ?  ?  ?   ?Objective:  ? Physical Exam ?Exam conducted with a chaperone present.  ?Constitutional:   ?   Appearance: Normal appearance.  ?Cardiovascular:  ?   Rate and Rhythm: Normal rate and regular rhythm.  ?   Pulses: Normal pulses.  ?   Heart sounds: Normal heart sounds.  ?Pulmonary:  ?   Effort: Pulmonary effort is normal.  ?   Breath sounds: Normal breath sounds.  ?Chest:  ?Breasts: ?   Right: Normal.  ?   Left: Normal.  ?   Comments: Mild breast asymmetry with the left breast approximately 1/2  cup size larger than the right ? ?No bruising  from recent biopsies. ?Musculoskeletal:  ?   Cervical back: Neck supple.  ?Lymphadenopathy:  ?   Upper Body:  ?   Right upper body: No supraclavicular or axillary adenopathy.  ?   Left upper body: No supracla

## 2022-02-17 ENCOUNTER — Other Ambulatory Visit: Payer: Self-pay | Admitting: *Deleted

## 2022-02-17 ENCOUNTER — Encounter: Payer: Self-pay | Admitting: *Deleted

## 2022-02-17 DIAGNOSIS — C50919 Malignant neoplasm of unspecified site of unspecified female breast: Secondary | ICD-10-CM

## 2022-02-17 NOTE — Progress Notes (Signed)
Received referral for newly diagnosed breast cancer from [facility] Radiology.  Navigation initiated.  Med Onc and Surgical consults scheduled. Patient has already seen Dr. Bary Castilla.  She requested to see Dr. Janese Banks for medical oncology.  That appt. Has been scheduled for 5/19 per her request since she is off work that day.   ?

## 2022-02-24 ENCOUNTER — Inpatient Hospital Stay: Payer: 59 | Attending: Oncology | Admitting: Oncology

## 2022-02-24 ENCOUNTER — Ambulatory Visit: Payer: 59 | Admitting: Oncology

## 2022-02-24 ENCOUNTER — Encounter: Payer: Self-pay | Admitting: Oncology

## 2022-02-24 ENCOUNTER — Inpatient Hospital Stay: Payer: 59

## 2022-02-24 VITALS — BP 114/68 | HR 69 | Temp 96.4°F | Resp 16 | Ht 62.0 in | Wt 200.7 lb

## 2022-02-24 DIAGNOSIS — Z7189 Other specified counseling: Secondary | ICD-10-CM

## 2022-02-24 DIAGNOSIS — Z170 Estrogen receptor positive status [ER+]: Secondary | ICD-10-CM | POA: Insufficient documentation

## 2022-02-24 DIAGNOSIS — C50412 Malignant neoplasm of upper-outer quadrant of left female breast: Secondary | ICD-10-CM | POA: Diagnosis not present

## 2022-02-24 DIAGNOSIS — D649 Anemia, unspecified: Secondary | ICD-10-CM | POA: Insufficient documentation

## 2022-02-24 DIAGNOSIS — C50919 Malignant neoplasm of unspecified site of unspecified female breast: Secondary | ICD-10-CM

## 2022-02-25 LAB — FSH/LH
FSH: 33.3 m[IU]/mL
LH: 21.6 m[IU]/mL

## 2022-02-25 LAB — ESTRADIOL: Estradiol: 27.5 pg/mL

## 2022-02-26 ENCOUNTER — Encounter: Payer: Self-pay | Admitting: Oncology

## 2022-02-26 DIAGNOSIS — Z7189 Other specified counseling: Secondary | ICD-10-CM | POA: Insufficient documentation

## 2022-02-26 DIAGNOSIS — Z170 Estrogen receptor positive status [ER+]: Secondary | ICD-10-CM | POA: Insufficient documentation

## 2022-02-26 DIAGNOSIS — C50412 Malignant neoplasm of upper-outer quadrant of left female breast: Secondary | ICD-10-CM | POA: Insufficient documentation

## 2022-02-26 NOTE — Progress Notes (Signed)
Hematology/Oncology Consult note [facility] Telephone:([phone] [phone]  Patient Care Team: Sabrina Late, MD as PCP - General (Family Medicine) Sabrina Huge, RN as Oncology Nurse Navigator   Name of the patient: Sabrina Oneill  [id number]  1971-03-21    Reason for referral-new diagnosis of breast cancer   Referring physician-Sabrina Oneill  Date of visit: 02/26/22   History of presenting illness- Patient is a 51 year old female with a prior history of iron deficiency anemia for which she has seen Sabrina Oneill in the past.  She recently underwent a screening mammogram in March 2023 which showed possible distortion in the left breast.  This was followed by diagnostic mammogram and ultrasound which showed hypoechoic mass 12 x 4 x 13 mm in the left breast at the 1 o'clock position.  There was another mass 6 x 8 x 8 mm at the 2 o'clock position 7 cm from the nipple.  No suspicious left axillary adenopathy.  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  The second breast mass was negative for malignancy and consistent with benign fibroadenoma.  Remote history of birth control.  She is s/p hysterectomy but still has ovaries in situ.  She is G3 P2.  Menarche at the age of 1 age of first pregnancy 31.  Family history significant for breast cancer in her paternal grandmother and maternal aunt.  ECOG PS- 0  Pain scale- 0   Review of systems- Review of Systems  Constitutional:  Negative for chills, fever, malaise/fatigue and weight loss.  HENT:  Negative for congestion, ear discharge and nosebleeds.   Eyes:  Negative for blurred vision.  Respiratory:  Negative for cough, hemoptysis, sputum production, shortness of breath and wheezing.   Cardiovascular:  Negative for chest pain, palpitations, orthopnea  and claudication.  Gastrointestinal:  Negative for abdominal pain, blood in stool, constipation, diarrhea, heartburn, melena, nausea and vomiting.  Genitourinary:  Negative for dysuria, flank pain, frequency, hematuria and urgency.  Musculoskeletal:  Negative for back pain, joint pain and myalgias.  Skin:  Negative for rash.  Neurological:  Negative for dizziness, tingling, focal weakness, seizures, weakness and headaches.  Endo/Heme/Allergies:  Does not bruise/bleed easily.  Psychiatric/Behavioral:  Negative for depression and suicidal ideas. The patient does not have insomnia.    No Known Allergies  Patient Active Problem List   Diagnosis Date Noted   Anemia 02/24/2022   Hypercholesterolemia 05/30/2021   Iron deficiency anemia due to chronic blood loss 07/12/2017   Scalp cyst 02/06/2017   Intramural leiomyoma of uterus 11/06/2014   Dysmenorrhea 04/06/2014     Past Medical History:  Diagnosis Date   Anemia      Past Surgical History:  Procedure Laterality Date   ABDOMINAL HYSTERECTOMY     BREAST BIOPSY Left 02/09/2022   [place] bx 2:00 7cmfn heart marker, path pending   BREAST BIOPSY Left 02/09/2022   [place] bx, 1:00 10cmfn coil marker, path pending   CESAREAN SECTION      Social History   Socioeconomic History   Marital status: Married    Spouse name: Not on file   Number of children: Not on file   Years of education: Not on file   Highest education level: Not on file  Occupational History   Not on file  Tobacco Use   Smoking status: Never  Smokeless tobacco: Never  Substance and Sexual Activity   Alcohol use: Not Currently    Alcohol/week: 0.0 standard drinks   Drug use: Not Currently   Sexual activity: Not on file  Other Topics Concern   Not on file  Social History Narrative   Not on file   Social Determinants of Health   Financial Resource Strain: Not on file  Food Insecurity: Not on file  Transportation Needs: Not on file  Physical Activity: Not on file   Stress: Not on file  Social Connections: Not on file  Intimate Partner Violence: Not on file     Family History  Problem Relation Age of Onset   Multiple myeloma Mother    Breast cancer Maternal Aunt    Breast cancer Paternal Grandmother        in her 40's     Current Outpatient Medications:    conjugated estrogens (PREMARIN) vaginal cream, Place 0.5 g vaginally once daily for two weeks, every other day for two weeks, then twice a week thereafter., Disp: 30 g, Rfl: 3   ibuprofen (ADVIL) 800 MG tablet, Take 1 tablet (800 mg total) by mouth every 8 (eight) hours as needed., Disp: 90 tablet, Rfl: 1   Multiple Vitamin (MULTIVITAMIN) capsule, Take 1 capsule by mouth daily. , Disp: , Rfl:    Multiple Vitamins-Minerals (EMERGEN-C IMMUNE PO), Take 1 packet by mouth daily., Disp: , Rfl:    naproxen sodium (ANAPROX) 550 MG tablet, Take 550 mg by mouth 2 (two) times daily as needed. , Disp: , Rfl:    COVID-19 At Home Antigen Test (CARESTART COVID-19 HOME TEST) KIT, use as directed within package instruction, Disp: 2 kit, Rfl: 0   lidocaine-prilocaine (EMLA) cream, Apply to areola one hour prior to arrival day of procedure. Cover with saran wrap. (Patient not taking: Reported on 02/24/2022), Disp: 30 g, Rfl: 0   meloxicam (MOBIC) 15 MG tablet, Take 1 tablet (15 mg total) by mouth daily. (Patient not taking: Reported on 02/24/2022), Disp: 30 tablet, Rfl: 1   methylPREDNISolone (MEDROL DOSEPAK) 4 MG TBPK tablet, 6 day dose pack - take as directed (Patient not taking: Reported on 02/24/2022), Disp: 21 tablet, Rfl: 0   UNABLE TO FIND, Take 85 mg by mouth daily. Hema-plex 85 mg (Patient not taking: Reported on 02/24/2022), Disp: , Rfl:   Current Facility-Administered Medications:    betamethasone acetate-betamethasone sodium phosphate (CELESTONE) injection 3 mg, 3 mg, Intra-articular, Once, Edrick Kins, DPM   Physical exam:  Vitals:   02/24/22 1353  BP: 114/68  Pulse: 69  Resp: 16  Temp: (!) 96.4  F (35.8 C)  SpO2: 100%  Weight: 200 lb 11.2 oz (91 kg)  Height: $Remove'5\' 2"'cfELQJL$  (1.575 m)   Physical Exam Constitutional:      General: She is not in acute distress. Cardiovascular:     Rate and Rhythm: Normal rate and regular rhythm.     Heart sounds: Normal heart sounds.  Pulmonary:     Effort: Pulmonary effort is normal.     Breath sounds: Normal breath sounds.  Abdominal:     General: Bowel sounds are normal.     Palpations: Abdomen is soft.  Skin:    General: Skin is warm and dry.  Neurological:     Mental Status: She is alert and oriented to person, place, and time.    Breast exam: No palpable masses in either breast.  No palpable bilateral axillary adenopathy.      Latest Ref Rng &  Units 05/24/2019    8:29 AM  CMP  Glucose 70 - 99 mg/dL 85    BUN 6 - 20 mg/dL 5    Creatinine 4.93 - 1.00 mg/dL 8.23    Sodium 510 - 504 mmol/L 139    Potassium 3.5 - 5.1 mmol/L 3.4    Chloride 98 - 111 mmol/L 107    CO2 22 - 32 mmol/L 23    Calcium 8.9 - 10.3 mg/dL 8.7    Total Protein 6.5 - 8.1 g/dL 6.7    Total Bilirubin 0.3 - 1.2 mg/dL 0.4    Alkaline Phos 38 - 126 U/L 50    AST 15 - 41 U/L 17    ALT 0 - 44 U/L 13        Latest Ref Rng & Units 10/31/2019    1:03 PM  CBC  WBC 4.0 - 10.5 K/uL 3.7    Hemoglobin 12.0 - 15.0 g/dL 7.9    Hematocrit 03.0 - 46.0 % 28.4    Platelets 150 - 400 K/uL 243      No images are attached to the encounter.  MM CLIP PLACEMENT LEFT  Result Date: 02/09/2022 CLINICAL DATA:  Status post 2 site ultrasound-guided biopsy EXAM: 3D DIAGNOSTIC LEFT MAMMOGRAM POST ULTRASOUND BIOPSY COMPARISON:  Previous exam(s). FINDINGS: Site 1:2 o'clock 7 cm 3D Mammographic images were obtained following ultrasound guided biopsy of a mass at 2 o'clock. The HEART biopsy marking clip is in expected position at the site of biopsy. Site 2:1 o'clock 10 cm from the nipple 3D Mammographic images were obtained following ultrasound guided biopsy of a mass at 1 o'clock. The COIL biopsy  marking clip is in expected position at the site of biopsy. Biopsy clips are approximately 6.2 cm apart. IMPRESSION: 1. Appropriate positioning of the HEART shaped biopsy marking clip at the site of biopsy in the site of architectural distortion. 2. Appropriate positioning of the COIL shaped biopsy marking clip at the site of biopsy in the upper outer breast posterior depth. Final Assessment: Post Procedure Mammograms for Marker Placement Electronically Signed   By: Meda Klinefelter M.D.   On: 02/09/2022 14:31  [place] LT BREAST BX W LOC DEV 1ST LESION IMG BX SPEC US GUIDE  Addendum Date: 02/10/2022   ADDENDUM REPORT: 02/10/2022 13:24 ADDENDUM: Pathology revealed BREAST, LEFT AT 2:00, 7 CM FROM NIPPLE; ULTRASOUND-GUIDED CORE NEEDLE BIOPSY: GRADE 1 INVASIVE MAMMARY CARCINOMA, WITH TUBULAR FEATURES, LOW GRADE DUCTAL CARCINOMA IN SITU (heart clip). This was found to be concordant by Dr. Meda Klinefelter. Pathology revealed BREAST, LEFT AT 1:00, 10 CM FROM THE NIPPLE; ULTRASOUND-GUIDED CORE NEEDLE BIOPSY: FRAGMENTS OF BENIGN FIBROADENOMA - NEGATIVE FOR ATYPICAL PROLIFERATIVE BREAST DISEASE (coil clip). This was found to be concordant by Dr. Meda Klinefelter. Pathology results were discussed with the patient by telephone. The patient reported doing well after the biopsies with tenderness at the sites. Post biopsy instructions and care were reviewed and questions were answered. The patient was encouraged to call [facility] for any additional concerns. Per patient request, surgical consultation has been arranged with Dr. Donnalee Curry at [facility] on Feb 14, 2022. Recommend breast MRI with and without contrast given young age and heterogeneous breast density with multiple waxing and waning scattered punctate calcifications noted mammographically. Pathology results reported by Collene Mares RN on 02/10/2022. Electronically Signed   By: Meda Klinefelter  M.D.   On: 02/10/2022 13:24   Result Date: 02/10/2022 CLINICAL DATA:  BI-RADS 5 LEFT breast mass at 2 o'clock with an additional incidentally sonographically identified oval mass at 1 o'clock. EXAM: ULTRASOUND GUIDED LEFT BREAST CORE NEEDLE BIOPSY x2 COMPARISON:  Previous exams. PROCEDURE: I met with the patient and we discussed the procedure of ultrasound-guided biopsy, including benefits and alternatives. We discussed the high likelihood of a successful procedure. We discussed the risks of the procedure, including infection, bleeding, tissue injury, clip migration, and inadequate sampling. Informed written consent was given. The usual time-out protocol was performed immediately prior to the procedure. Site 1: 2 o'clock 7 cm from the nipple Lesion quadrant: Upper outer quadrant Using sterile technique and 1% lidocaine and 1% lidocaine with epinephrine as local anesthetic, under direct ultrasound visualization, a 14 gauge spring-loaded device was used to perform biopsy of a mass at 2 o'clock using a lateral approach. At the conclusion of the procedure a HEART tissue marker clip was deployed into the biopsy cavity. Follow up 2 view mammogram was performed and dictated separately. Site 2: 1 o'clock 10 cm from the nipple Lesion quadrant: Upper outer quadrant Using sterile technique and 1% lidocaine and 1% lidocaine with epinephrine as local anesthetic, under direct ultrasound visualization, a 14 gauge spring-loaded device was used to perform biopsy of a mass at 1 o'clock using a lateral approach. At the conclusion of the procedure a COIL tissue marker clip was deployed into the biopsy cavity. Follow up 2 view mammogram was performed and dictated separately. IMPRESSION: Ultrasound guided biopsy of a LEFT-sided breast mass at 2 o'clock and 1 o'clock. No apparent complications. Electronically Signed: By: Valentino Saxon M.D. On: 02/09/2022 14:28   [place] LT BREAST BX W LOC DEV EA ADD LESION IMG BX SPEC US  GUIDE  Addendum Date: 02/10/2022   ADDENDUM REPORT: 02/10/2022 13:24 ADDENDUM: Pathology revealed BREAST, LEFT AT 2:00, 7 CM FROM NIPPLE; ULTRASOUND-GUIDED CORE NEEDLE BIOPSY: GRADE 1 INVASIVE MAMMARY CARCINOMA, WITH TUBULAR FEATURES, LOW GRADE DUCTAL CARCINOMA IN SITU (heart clip). This was found to be concordant by Dr. Valentino Saxon. Pathology revealed BREAST, LEFT AT 1:00, 10 CM FROM THE NIPPLE; ULTRASOUND-GUIDED CORE NEEDLE BIOPSY: FRAGMENTS OF BENIGN FIBROADENOMA - NEGATIVE FOR ATYPICAL PROLIFERATIVE BREAST DISEASE (coil clip). This was found to be concordant by Dr. Valentino Saxon. Pathology results were discussed with the patient by telephone. The patient reported doing well after the biopsies with tenderness at the sites. Post biopsy instructions and care were reviewed and questions were answered. The patient was encouraged to call [facility] for any additional concerns. Per patient request, surgical consultation has been arranged with Dr. Hervey Ard at [facility] on Feb 14, 2022. Recommend breast MRI with and without contrast given young age and heterogeneous breast density with multiple waxing and waning scattered punctate calcifications noted mammographically. Pathology results reported by Stacie Acres RN on 02/10/2022. Electronically Signed   By: Valentino Saxon M.D.   On: 02/10/2022 13:24   Result Date: 02/10/2022 CLINICAL DATA:  BI-RADS 5 LEFT breast mass at 2 o'clock with an additional incidentally sonographically identified oval mass at 1 o'clock. EXAM: ULTRASOUND GUIDED LEFT BREAST CORE NEEDLE BIOPSY x2 COMPARISON:  Previous exams. PROCEDURE: I met with the patient and we discussed the procedure of ultrasound-guided biopsy, including benefits and alternatives. We discussed the high likelihood of a successful procedure. We discussed the risks of the procedure, including infection, bleeding, tissue injury, clip migration,  and inadequate sampling. Informed written consent was given. The usual time-out protocol was performed immediately  prior to the procedure. Site 1: 2 o'clock 7 cm from the nipple Lesion quadrant: Upper outer quadrant Using sterile technique and 1% lidocaine and 1% lidocaine with epinephrine as local anesthetic, under direct ultrasound visualization, a 14 gauge spring-loaded device was used to perform biopsy of a mass at 2 o'clock using a lateral approach. At the conclusion of the procedure a HEART tissue marker clip was deployed into the biopsy cavity. Follow up 2 view mammogram was performed and dictated separately. Site 2: 1 o'clock 10 cm from the nipple Lesion quadrant: Upper outer quadrant Using sterile technique and 1% lidocaine and 1% lidocaine with epinephrine as local anesthetic, under direct ultrasound visualization, a 14 gauge spring-loaded device was used to perform biopsy of a mass at 1 o'clock using a lateral approach. At the conclusion of the procedure a COIL tissue marker clip was deployed into the biopsy cavity. Follow up 2 view mammogram was performed and dictated separately. IMPRESSION: Ultrasound guided biopsy of a LEFT-sided breast mass at 2 o'clock and 1 o'clock. No apparent complications. Electronically Signed: By: Valentino Saxon M.D. On: 02/09/2022 14:28    Assessment and plan- Patient is a 51 y.o. female with newly diagnosed invasive mammary carcinoma with tubular features clinical prognostic stage IA cT1b cN0 cM0 ER/PR positive HER2 negative here to discuss further management  Discussed the results of mammogram and ultrasound with the patient in detail.  Mammogram shows a 12 mm left breast mass at the 1 o'clock position which was biopsy-proven invasive mammary carcinoma with tubular features.  The other smaller breast mass was a fibroadenoma.  Tumor was greater than 90% ER positive, PR 81 to 90% positive and HER2 negative grade 1 which is favorable histology.  At this time I would  recommend upfront lumpectomy with sentinel lymph node biopsy and patient is already seen Sabrina Oneill regarding this.  I will see her back after final pathology result is back and decide if Oncotype testing needs to be sent or not based on the size and grade of the final tumor specimen.  Discussed how Oncotype testing is done and how the results are interpreted.  I will be obtaining baseline hormone levels FSH and estradiol to determine if she is pre or postmenopausal.  She would benefit from adjuvant radiation treatment as well.  Given that her tumor is ER positive there would be a role for endocrine therapy for at least 5 years upon completion of adjuvant radiation treatment which I will discuss with her in greater detail after final pathology is back.  Treatment will be given with a curative intent   Cancer Staging  Malignant neoplasm of upper-outer quadrant of left breast in female, estrogen receptor positive ([place]) Staging form: Breast, AJCC 8th Edition - Clinical stage from 02/26/2022: Stage IA (cT1b, cN0, cM0, G1, ER+, PR+, HER2-) - Signed by Sindy Guadeloupe, MD on 02/26/2022 Histologic grading system: 3 grade system     Thank you for this kind referral and the opportunity to participate in the care of this patient   Visit Diagnosis 1. Invasive carcinoma of breast ([place])     Dr. Randa Evens, MD, MPH [facility] [phone] 02/26/2022

## 2022-02-27 ENCOUNTER — Ambulatory Visit
Admission: RE | Admit: 2022-02-27 | Discharge: 2022-02-27 | Disposition: A | Discharge status: Home / Self Care | Payer: 59 | Source: Ambulatory Visit | Attending: Oncology | Admitting: Oncology

## 2022-02-27 DIAGNOSIS — Z170 Estrogen receptor positive status [ER+]: Secondary | ICD-10-CM | POA: Insufficient documentation

## 2022-02-27 DIAGNOSIS — C50412 Malignant neoplasm of upper-outer quadrant of left female breast: Secondary | ICD-10-CM | POA: Diagnosis not present

## 2022-02-27 DIAGNOSIS — M8589 Other specified disorders of bone density and structure, multiple sites: Secondary | ICD-10-CM | POA: Diagnosis not present

## 2022-02-28 ENCOUNTER — Other Ambulatory Visit: Payer: Self-pay | Admitting: General Surgery

## 2022-02-28 ENCOUNTER — Ambulatory Visit: Payer: 59 | Admitting: Podiatry

## 2022-02-28 DIAGNOSIS — C50412 Malignant neoplasm of upper-outer quadrant of left female breast: Secondary | ICD-10-CM | POA: Diagnosis not present

## 2022-02-28 DIAGNOSIS — Z170 Estrogen receptor positive status [ER+]: Secondary | ICD-10-CM | POA: Diagnosis not present

## 2022-02-28 DIAGNOSIS — D242 Benign neoplasm of left breast: Secondary | ICD-10-CM

## 2022-03-01 ENCOUNTER — Other Ambulatory Visit: Payer: Self-pay | Admitting: General Surgery

## 2022-03-01 DIAGNOSIS — D242 Benign neoplasm of left breast: Secondary | ICD-10-CM

## 2022-03-03 ENCOUNTER — Encounter
Admission: RE | Admit: 2022-03-03 | Discharge: 2022-03-03 | Disposition: A | Discharge status: Home / Self Care | Payer: 59 | Source: Ambulatory Visit | Attending: General Surgery | Admitting: General Surgery

## 2022-03-03 ENCOUNTER — Other Ambulatory Visit: Payer: Self-pay

## 2022-03-03 HISTORY — DX: Malignant (primary) neoplasm, unspecified: C80.1

## 2022-03-03 NOTE — Patient Instructions (Addendum)
Your procedure is scheduled on: 03/13/22 - Monday Report to the Registration Desk on the 1st floor of the [facility]. To find out your arrival time, please call [phone] between 1PM - 3PM on: 03/10/22 - Friday If your arrival time is 6:00 am, do not arrive prior to that time as the [facility] entrance doors do not open until 6:00 am.  REMEMBER: Instructions that are not followed completely may result in serious medical risk, up to and including death; or upon the discretion of your surgeon and anesthesiologist your surgery may need to be rescheduled.  Do not eat food after midnight the night before surgery.  No gum chewing, lozengers or hard candies.  You may however, drink CLEAR liquids up to 2 hours before you are scheduled to arrive for your surgery. Do not drink anything within 2 hours of your scheduled arrival time.  Clear liquids include: - water  - apple juice without pulp - gatorade (not RED colors) - black coffee or tea (Do NOT add milk or creamers to the coffee or tea) Do NOT drink anything that is not on this list.  TAKE THESE MEDICATIONS THE MORNING OF SURGERY WITH A SIP OF WATER: NONE  One week prior to surgery: Stop Anti-inflammatories (NSAIDS) such as Advil, Aleve, Ibuprofen, Motrin, Naproxen, Naprosyn and Aspirin based products such as Excedrin, Goodys Powder, BC Powder.  Stop ANY OVER THE COUNTER supplements until after surgery.(EMERGEN-C IMMUNE PO), Multiple Vitamin .  You may take Tylenol if needed for pain up until the day of surgery.  No Alcohol for 24 hours before or after surgery.  No Smoking including e-cigarettes for 24 hours prior to surgery.  No chewable tobacco products for at least 6 hours prior to surgery.  No nicotine patches on the day of surgery.  Do not use any "recreational" drugs for at least a week prior to your surgery.  Please be advised that the combination of cocaine and anesthesia may have negative outcomes, up to and including  death. If you test positive for cocaine, your surgery will be cancelled.  On the morning of surgery brush your teeth with toothpaste and water, you may rinse your mouth with mouthwash if you wish. Do not swallow any toothpaste or mouthwash.  Do not wear jewelry, make-up, hairpins, clips or nail polish.  Do not wear lotions, powders, or perfumes.   Do not shave body from the neck down 48 hours prior to surgery just in case you cut yourself which could leave a site for infection.  Also, freshly shaved skin may become irritated if using the CHG soap.  Contact lenses, hearing aids and dentures may not be worn into surgery.  Do not bring valuables to the hospital. [facility] is not responsible for any missing/lost belongings or valuables.   Notify your doctor if there is any change in your medical condition (cold, fever, infection).  Wear comfortable clothing (specific to your surgery type) to the hospital.  After surgery, you can help prevent lung complications by doing breathing exercises.  Take deep breaths and cough every 1-2 hours. Your doctor may order a device called an Incentive Spirometer to help you take deep breaths. When coughing or sneezing, hold a pillow firmly against your incision with both hands. This is called "splinting." Doing this helps protect your incision. It also decreases belly discomfort.  If you are being admitted to the hospital overnight, leave your suitcase in the car. After surgery it may be brought to your room.  If you are being discharged the day of surgery, you will not be allowed to drive home. You will need a responsible adult (18 years or older) to drive you home and stay with you that night.   If you are taking public transportation, you will need to have a responsible adult (18 years or older) with you. Please confirm with your physician that it is acceptable to use public transportation.   Please call the [facility] at [phone] if you have any questions about these instructions.  Surgery Visitation Policy:  Patients undergoing a surgery or procedure may have two family members or support persons with them as long as the person is not COVID-19 positive or experiencing its symptoms.   Inpatient Visitation:    Visiting hours are 7 a.m. to 8 p.m. Up to four visitors are allowed at one time in a patient room, including children. The visitors may rotate out with other people during the day. One designated support person (adult) may remain overnight.

## 2022-03-13 ENCOUNTER — Ambulatory Visit
Admission: RE | Admit: 2022-03-13 | Discharge: 2022-03-13 | Disposition: A | Discharge status: Home / Self Care | Payer: 59 | Source: Ambulatory Visit | Attending: General Surgery | Admitting: General Surgery

## 2022-03-13 ENCOUNTER — Ambulatory Visit: Payer: 59 | Admitting: Anesthesiology

## 2022-03-13 ENCOUNTER — Ambulatory Visit
Admission: RE | Admit: 2022-03-13 | Discharge: 2022-03-13 | Disposition: A | Discharge status: Home / Self Care | Payer: 59 | Attending: General Surgery | Admitting: General Surgery

## 2022-03-13 ENCOUNTER — Other Ambulatory Visit: Payer: Self-pay

## 2022-03-13 ENCOUNTER — Encounter
Admission: RE | Disposition: A | Discharge status: Home / Self Care | Payer: Self-pay | Source: Home / Self Care | Attending: General Surgery

## 2022-03-13 ENCOUNTER — Ambulatory Visit: Payer: 59

## 2022-03-13 ENCOUNTER — Encounter: Payer: Self-pay | Admitting: General Surgery

## 2022-03-13 DIAGNOSIS — D242 Benign neoplasm of left breast: Secondary | ICD-10-CM

## 2022-03-13 DIAGNOSIS — E7800 Pure hypercholesterolemia, unspecified: Secondary | ICD-10-CM

## 2022-03-13 DIAGNOSIS — Z170 Estrogen receptor positive status [ER+]: Secondary | ICD-10-CM | POA: Insufficient documentation

## 2022-03-13 DIAGNOSIS — C50412 Malignant neoplasm of upper-outer quadrant of left female breast: Secondary | ICD-10-CM | POA: Diagnosis not present

## 2022-03-13 DIAGNOSIS — C50912 Malignant neoplasm of unspecified site of left female breast: Secondary | ICD-10-CM | POA: Diagnosis not present

## 2022-03-13 HISTORY — PX: AXILLARY SENTINEL NODE BIOPSY: SHX5738

## 2022-03-13 HISTORY — PX: BREAST LUMPECTOMY WITH NEEDLE LOCALIZATION: SHX5759

## 2022-03-13 HISTORY — PX: BREAST LUMPECTOMY: SHX2

## 2022-03-13 LAB — BASIC METABOLIC PANEL
Anion gap: 6 (ref 5–15)
BUN: 10 mg/dL (ref 6–20)
CO2: 21 mmol/L — ABNORMAL LOW (ref 22–32)
Calcium: 9 mg/dL (ref 8.9–10.3)
Chloride: 109 mmol/L (ref 98–111)
Creatinine, Ser: 0.83 mg/dL (ref 0.44–1.00)
GFR, Estimated: >60 mL/min (ref >60)
Glucose, Bld: 94 mg/dL (ref 70–99)
Potassium: 3.8 mmol/L (ref 3.5–5.1)
Sodium: 136 mmol/L (ref 135–145)

## 2022-03-13 LAB — CBC
HCT: 37.6 % (ref 36.0–46.0)
Hemoglobin: 12.4 g/dL (ref 12.0–15.0)
MCH: 29 pg (ref 26.0–34.0)
MCHC: 33 g/dL (ref 30.0–36.0)
MCV: 88.1 fL (ref 80.0–100.0)
Platelets: 192 10*3/uL (ref 150–400)
RBC: 4.27 MIL/uL (ref 3.87–5.11)
RDW: 11.9 % (ref 11.5–15.5)
WBC: 4.6 10*3/uL (ref 4.0–10.5)
nRBC: 0 % (ref 0.0–0.2)

## 2022-03-13 SURGERY — BREAST LUMPECTOMY WITH NEEDLE LOCALIZATION
Anesthesia: General | Site: Breast | Laterality: Left

## 2022-03-13 MED ORDER — ROCURONIUM BROMIDE 100 MG/10ML IV SOLN
INTRAVENOUS | Status: DC | PRN
  Administered 2022-03-13: 50 mg via INTRAVENOUS

## 2022-03-13 MED ORDER — DEXMEDETOMIDINE (PRECEDEX) IN NS 20 MCG/5ML (4 MCG/ML) IV SYRINGE
PREFILLED_SYRINGE | INTRAVENOUS | Status: DC | PRN
  Administered 2022-03-13: 4 ug via INTRAVENOUS

## 2022-03-13 MED ORDER — BUPIVACAINE-EPINEPHRINE (PF) 0.5% -1:200000 IJ SOLN
INTRAMUSCULAR | Status: AC
Start: 2022-03-13 — End: ?
  Filled 2022-03-13: qty 30

## 2022-03-13 MED ORDER — SUGAMMADEX SODIUM 200 MG/2ML IV SOLN
INTRAVENOUS | Status: DC | PRN
  Administered 2022-03-13: 200 mg via INTRAVENOUS

## 2022-03-13 MED ORDER — HYDROCODONE-ACETAMINOPHEN 5-325 MG PO TABS
1.0000 | ORAL_TABLET | ORAL | 0 refills | Status: DC | PRN
  Filled 2022-03-13: qty 12, 2d supply, fill #0

## 2022-03-13 MED ORDER — KETOROLAC TROMETHAMINE 30 MG/ML IJ SOLN
INTRAMUSCULAR | Status: DC | PRN
  Administered 2022-03-13: 30 mg via INTRAVENOUS

## 2022-03-13 MED ORDER — METHYLENE BLUE 1 % INJ SOLN
INTRAVENOUS | Status: AC
  Filled 2022-03-13: qty 10

## 2022-03-13 MED ORDER — TECHNETIUM TC 99M TILMANOCEPT KIT
1.0000 | PACK | Freq: Once | INTRAVENOUS | Status: AC | PRN
  Administered 2022-03-13: 1.22 via INTRADERMAL

## 2022-03-13 MED ORDER — FAMOTIDINE 20 MG PO TABS
ORAL_TABLET | ORAL | Status: AC
  Administered 2022-03-13: 20 mg via ORAL
  Filled 2022-03-13: qty 1

## 2022-03-13 MED ORDER — FENTANYL CITRATE (PF) 100 MCG/2ML IJ SOLN
25.0000 ug | INTRAMUSCULAR | Status: DC | PRN
  Administered 2022-03-13 (×3): 25 ug via INTRAVENOUS

## 2022-03-13 MED ORDER — MIDAZOLAM HCL 2 MG/2ML IJ SOLN
INTRAMUSCULAR | Status: AC
  Filled 2022-03-13: qty 2

## 2022-03-13 MED ORDER — LACTATED RINGERS IV SOLN: INTRAVENOUS | Status: DC

## 2022-03-13 MED ORDER — STERILE WATER FOR IRRIGATION IR SOLN
Status: DC | PRN
  Administered 2022-03-13: 1000 mL

## 2022-03-13 MED ORDER — BUPIVACAINE-EPINEPHRINE (PF) 0.5% -1:200000 IJ SOLN
INTRAMUSCULAR | Status: DC | PRN
  Administered 2022-03-13: 30 mL

## 2022-03-13 MED ORDER — MIDAZOLAM HCL 2 MG/2ML IJ SOLN
INTRAMUSCULAR | Status: DC | PRN
  Administered 2022-03-13: 2 mg via INTRAVENOUS

## 2022-03-13 MED ORDER — FENTANYL CITRATE (PF) 100 MCG/2ML IJ SOLN
INTRAMUSCULAR | Status: AC
  Filled 2022-03-13: qty 2

## 2022-03-13 MED ORDER — OXYCODONE HCL 5 MG PO TABS
ORAL_TABLET | ORAL | Status: AC
  Administered 2022-03-13: 5 mg via ORAL
  Filled 2022-03-13: qty 1

## 2022-03-13 MED ORDER — ACETAMINOPHEN 10 MG/ML IV SOLN
INTRAVENOUS | Status: DC | PRN
  Administered 2022-03-13: 1000 mg via INTRAVENOUS

## 2022-03-13 MED ORDER — CHLORHEXIDINE GLUCONATE 0.12 % MT SOLN
OROMUCOSAL | Status: AC
  Administered 2022-03-13: 15 mL via OROMUCOSAL
  Filled 2022-03-13: qty 15

## 2022-03-13 MED ORDER — CHLORHEXIDINE GLUCONATE 0.12 % MT SOLN: 15.0000 mL | Freq: Once | OROMUCOSAL | Status: AC

## 2022-03-13 MED ORDER — FAMOTIDINE 20 MG PO TABS: 20.0000 mg | ORAL_TABLET | Freq: Once | ORAL | Status: AC

## 2022-03-13 MED ORDER — OXYCODONE HCL 5 MG PO TABS: 5.0000 mg | ORAL_TABLET | Freq: Once | ORAL | Status: AC | PRN

## 2022-03-13 MED ORDER — CHLORHEXIDINE GLUCONATE CLOTH 2 % EX PADS
6.0000 | MEDICATED_PAD | Freq: Once | CUTANEOUS | Status: AC
  Administered 2022-03-13: 6 via TOPICAL

## 2022-03-13 MED ORDER — OXYCODONE HCL 5 MG/5ML PO SOLN: 5.0000 mg | Freq: Once | ORAL | Status: AC | PRN

## 2022-03-13 MED ORDER — LIDOCAINE HCL (CARDIAC) PF 100 MG/5ML IV SOSY
PREFILLED_SYRINGE | INTRAVENOUS | Status: DC | PRN
  Administered 2022-03-13: 100 mg via INTRAVENOUS

## 2022-03-13 MED ORDER — ORAL CARE MOUTH RINSE: 15.0000 mL | Freq: Once | OROMUCOSAL | Status: AC

## 2022-03-13 MED ORDER — FENTANYL CITRATE (PF) 100 MCG/2ML IJ SOLN
INTRAMUSCULAR | Status: DC | PRN
  Administered 2022-03-13: 50 ug via INTRAVENOUS
  Administered 2022-03-13: 25 ug via INTRAVENOUS
  Administered 2022-03-13: 50 ug via INTRAVENOUS
  Administered 2022-03-13: 25 ug via INTRAVENOUS

## 2022-03-13 MED ORDER — DEXAMETHASONE SODIUM PHOSPHATE 10 MG/ML IJ SOLN
INTRAMUSCULAR | Status: DC | PRN
  Administered 2022-03-13: 10 mg via INTRAVENOUS

## 2022-03-13 MED ORDER — FENTANYL CITRATE (PF) 100 MCG/2ML IJ SOLN
INTRAMUSCULAR | Status: AC
  Administered 2022-03-13: 25 ug via INTRAVENOUS
  Filled 2022-03-13: qty 2

## 2022-03-13 MED ORDER — ONDANSETRON HCL 4 MG/2ML IJ SOLN
INTRAMUSCULAR | Status: DC | PRN
  Administered 2022-03-13: 4 mg via INTRAVENOUS

## 2022-03-13 MED ORDER — METHYLENE BLUE 1 % INJ SOLN
INTRAVENOUS | Status: DC | PRN
  Administered 2022-03-13: 5 mL

## 2022-03-13 MED ORDER — PROPOFOL 10 MG/ML IV BOLUS
INTRAVENOUS | Status: DC | PRN
  Administered 2022-03-13: 200 mg via INTRAVENOUS

## 2022-03-13 MED ORDER — CHLORHEXIDINE GLUCONATE CLOTH 2 % EX PADS
6.0000 | MEDICATED_PAD | Freq: Once | CUTANEOUS | Status: DC
Start: 2022-03-13 — End: 2022-03-13

## 2022-03-13 SURGICAL SUPPLY — 62 items
APL PRP STRL LF DISP 70% ISPRP (MISCELLANEOUS) ×1
APPLIER CLIP 11 MED OPEN (CLIP) ×2
APR CLP MED 11 20 MLT OPN (CLIP) ×1
BLADE BOVIE TIP EXT 4 (BLADE) IMPLANT
BLADE SURG 15 STRL SS SAFETY (BLADE) ×4 IMPLANT
BULB RESERV EVAC DRAIN JP 100C (MISCELLANEOUS) IMPLANT
CHLORAPREP W/TINT 26 (MISCELLANEOUS) ×2 IMPLANT
CLIP APPLIE 11 MED OPEN (CLIP) IMPLANT
CNTNR SPEC 2.5X3XGRAD LEK (MISCELLANEOUS) ×1
CONT SPEC 4OZ STER OR WHT (MISCELLANEOUS) ×1
CONT SPEC 4OZ STRL OR WHT (MISCELLANEOUS) ×1
CONTAINER SPEC 2.5X3XGRAD LEK (MISCELLANEOUS) IMPLANT
COVER PROBE FLX POLY STRL (MISCELLANEOUS) ×2 IMPLANT
DEVICE DUBIN SPECIMEN MAMMOGRA (MISCELLANEOUS) ×3 IMPLANT
DRAIN CHANNEL JP 15F RND 16 (MISCELLANEOUS) IMPLANT
DRAPE LAPAROTOMY TRNSV 106X77 (MISCELLANEOUS) ×2 IMPLANT
DRSG GAUZE FLUFF 36X18 (GAUZE/BANDAGES/DRESSINGS) ×4 IMPLANT
DRSG TELFA 3X8 NADH (GAUZE/BANDAGES/DRESSINGS) ×2 IMPLANT
ELECT CAUTERY BLADE TIP 2.5 (TIP) ×2
ELECT REM PT RETURN 9FT ADLT (ELECTROSURGICAL) ×2
ELECTRODE CAUTERY BLDE TIP 2.5 (TIP) ×1 IMPLANT
ELECTRODE REM PT RTRN 9FT ADLT (ELECTROSURGICAL) ×1 IMPLANT
GAUZE 4X4 16PLY ~~LOC~~+RFID DBL (SPONGE) ×2 IMPLANT
GLOVE BIO SURGEON STRL SZ7.5 (GLOVE) ×2 IMPLANT
GLOVE SURG UNDER LTX SZ8 (GLOVE) ×2 IMPLANT
GOWN STRL REUS W/ TWL LRG LVL3 (GOWN DISPOSABLE) ×2 IMPLANT
GOWN STRL REUS W/TWL LRG LVL3 (GOWN DISPOSABLE) ×4
KIT TURNOVER KIT A (KITS) ×2 IMPLANT
LABEL OR SOLS (LABEL) ×2 IMPLANT
MANIFOLD NEPTUNE II (INSTRUMENTS) ×2 IMPLANT
MARGIN MAP 10MM (MISCELLANEOUS) ×2 IMPLANT
NDL HYPO 25X1 1.5 SAFETY (NEEDLE) ×2 IMPLANT
NDL SPNL 20GX3.5 QUINCKE YW (NEEDLE) IMPLANT
NEEDLE HYPO 22GX1.5 SAFETY (NEEDLE) ×2 IMPLANT
NEEDLE HYPO 25X1 1.5 SAFETY (NEEDLE) ×4 IMPLANT
NEEDLE SPNL 20GX3.5 QUINCKE YW (NEEDLE) IMPLANT
PACK BASIN MINOR ARMC (MISCELLANEOUS) ×2 IMPLANT
PAD DRESSING TELFA 3X8 NADH (GAUZE/BANDAGES/DRESSINGS) ×1 IMPLANT
PENCIL ELECTRO HAND CTR (MISCELLANEOUS) ×2 IMPLANT
RETRACTOR RING XSMALL (MISCELLANEOUS) IMPLANT
RTRCTR WOUND ALEXIS 13CM XS SH (MISCELLANEOUS) ×2
SHEARS FOC LG CVD HARMONIC 17C (MISCELLANEOUS) IMPLANT
SHEARS HARMONIC 9CM CVD (BLADE) IMPLANT
SLEVE PROBE SENORX GAMMA FIND (MISCELLANEOUS) ×1 IMPLANT
STRIP CLOSURE SKIN 1/2X4 (GAUZE/BANDAGES/DRESSINGS) ×2 IMPLANT
SUT ETHILON 3-0 FS-10 30 BLK (SUTURE) ×2
SUT SILK 2 0 (SUTURE) ×2
SUT SILK 2-0 18XBRD TIE 12 (SUTURE) ×1 IMPLANT
SUT VIC AB 2-0 CT1 27 (SUTURE) ×4
SUT VIC AB 2-0 CT1 TAPERPNT 27 (SUTURE) ×2 IMPLANT
SUT VIC AB 3-0 54X BRD REEL (SUTURE) ×1 IMPLANT
SUT VIC AB 3-0 BRD 54 (SUTURE) ×2
SUT VIC AB 3-0 SH 27 (SUTURE) ×2
SUT VIC AB 3-0 SH 27X BRD (SUTURE) ×2 IMPLANT
SUT VIC AB 4-0 FS2 27 (SUTURE) ×4 IMPLANT
SUTURE EHLN 3-0 FS-10 30 BLK (SUTURE) ×1 IMPLANT
SWABSTK COMLB BENZOIN TINCTURE (MISCELLANEOUS) ×2 IMPLANT
SYR 10ML LL (SYRINGE) ×2 IMPLANT
SYR BULB IRRIG 60ML STRL (SYRINGE) ×2 IMPLANT
TAPE TRANSPORE STRL 2 31045 (GAUZE/BANDAGES/DRESSINGS) ×2 IMPLANT
TRAP NEPTUNE SPECIMEN COLLECT (MISCELLANEOUS) ×2 IMPLANT
WATER STERILE IRR 1000ML POUR (IV SOLUTION) ×2 IMPLANT

## 2022-03-13 NOTE — Transfer of Care (Signed)
Immediate Anesthesia Transfer of Care Note  Patient: Sabrina [facility]  Procedure(s) Performed: BREAST LUMPECTOMY WITH NEEDLE LOCALIZATION (Left: Breast) AXILLARY SENTINEL NODE BIOPSY (Left: Breast)  Patient Location: PACU  Anesthesia Type:General  Level of Consciousness: awake  Airway & Oxygen Therapy: Patient Spontanous Breathing  Post-op Assessment: Report given to RN and Post -op Vital signs reviewed and stable  Post vital signs: Reviewed and stable  Last Vitals:  Vitals Value Taken Time  BP 122/70 03/13/22 1216  Temp    Pulse 88 03/13/22 1219  Resp 18 03/13/22 1219  SpO2 100 % 03/13/22 1219  Vitals shown include unvalidated device data.  Last Pain:  Vitals:   03/13/22 0913  TempSrc: Oral  PainSc: 0-No pain         Complications: No notable events documented.

## 2022-03-13 NOTE — Op Note (Addendum)
Preoperative diagnosis: 1) invasive mammary carcinoma left breast, 2) fibroadenoma left breast.  Postoperative diagnosis: Same.  Operative procedure: 1) wide excision left breast infra invasive mammary carcinoma with ultrasound guidance; 2) excision left breast fibroadenoma with wire localization; 3) left axillary sentinel node biopsy with methylene blue injection.  Operating surgeon: Hervey Ard, MD.  Anesthesia: General endotracheal, Marcaine 0.5% with 1: 200,000 units of epinephrine, 30 cc.  Estimated blood loss: Less than 5 cc.  Clinical note: This 51 year old woman was recent identified with an invasive mammary carcinoma of the breast and at the same time a fibroadenoma about 5 cm away.  She desired breast conservation.  She was injected with technetium sulfur colloid the morning the procedure.  She underwent wire localization of the fibroadenomas was not clearly evident on office ultrasound.  She had SCD stockings for DVT prevention.  Antibiotics were not indicated.  Operative note: With the patient under adequate general endotracheal anesthesia the left breast chest and axilla was cleansed with ChloraPrep and draped.  Ultrasound was used to identify the tumor site in the 2 o'clock position, about 7 cm from the nipple and the final localization of the wire in the 1 o'clock position about 10 cm from the nipple.  These were marked externally.  An intrapectoral block was placed under ultrasound guidance.  A curvilinear incision in the upper outer quadrant of the breast was made overlying the tumor site.  The skin was incised sharply and remaining dissection completed with electrocautery.  A 3 x 3 x 3 cm block of tissue was excised, orientated and specimen radiograph confirmed the previously placed clip and tumor mass.  While the pathology report was pending attention was turned to the fibroadenoma.  In the plane between the breast parenchyma which was fairly dense for age and the  subcutaneous fat the localizing wire was identified.  An extra small Alexis wound protector was placed for better exposure.  The fibroadenoma was excised and specimen radiograph confirmed the clip and the tip of the wire.  Making use of the node seeker area of increased uptake at the base of the axillary skin fold was identified.  A small transverse incision was made.  The axillary envelope was opened in a hot blue node with counts of 14,000 was identified.  Adjacent nodes of [phone] identified in the same cluster.  A separate small 4 mm node with counts of 600 was removed as well.  All 4 nodes were sent together for routine analysis.  The axillary envelope was closed with interrupted 2-0 Vicryl figure-of-eight sutures and the adipose layer closed in a similar fashion.  The skin was closed with a running 4-0 Vicryl subcuticular suture.  With the pathology report showing clear gross margins attention was turned towards wound closure.  A medium Hemoclip was placed in the axillary tail at the site of the fibroadenoma and then multiple hemoclips placed around the tumor bed for improved identification at times radiation therapy.  The breast parenchyma was approximated with interrupted 2-0 Vicryl figure-of-eight sutures.  The this was done in 2 layers.  Subcutaneous fat was approximated with a running 2-0 Vicryl suture.  The skin was closed with a running 4-0 Vicryl subcuticular suture.  Benzoin and Steri-Strips followed by Telfa, fluff gauze and a compressive wrap were applied.  The patient tolerated the procedure well and was taken to the PACU in stable condition.

## 2022-03-13 NOTE — Anesthesia Postprocedure Evaluation (Signed)
Anesthesia Post Note  Patient: Sabrina Oneill  Procedure(s) Performed: BREAST LUMPECTOMY WITH NEEDLE LOCALIZATION (Left: Breast) AXILLARY SENTINEL NODE BIOPSY (Left: Breast)  Patient location during evaluation: PACU Anesthesia Type: General Level of consciousness: awake and alert Pain management: pain level controlled Vital Signs Assessment: post-procedure vital signs reviewed and stable Respiratory status: spontaneous breathing, nonlabored ventilation, respiratory function stable and patient connected to nasal cannula oxygen Cardiovascular status: blood pressure returned to baseline and stable Postop Assessment: no apparent nausea or vomiting Anesthetic complications: no   No notable events documented.   Last Vitals:  Vitals:   03/13/22 1300 03/13/22 1315  BP: 130/85 115/72  Pulse: 72 63  Resp: (!) 9 15  Temp: 36.5 C   SpO2: 99% 96%    Last Pain:  Vitals:   03/13/22 1315  TempSrc:   PainSc: 4                  Precious Haws Ashvin Adelson

## 2022-03-13 NOTE — Anesthesia Procedure Notes (Signed)
Procedure Name: Intubation Date/Time: 03/13/2022 10:28 AM Performed by: Biagio Borg, CRNA Pre-anesthesia Checklist: Patient identified, Emergency Drugs available, Suction available and Patient being monitored Patient Re-evaluated:Patient Re-evaluated prior to induction Oxygen Delivery Method: Circle system utilized Preoxygenation: Pre-oxygenation with 100% oxygen Induction Type: IV induction Ventilation: Mask ventilation without difficulty Grade View: Grade I Tube type: Oral Tube size: 7.0 mm Number of attempts: 1 Airway Equipment and Method: Stylet Placement Confirmation: ETT inserted through vocal cords under direct vision, positive ETCO2 and breath sounds checked- equal and bilateral Secured at: 21 cm Tube secured with: Tape Dental Injury: Teeth and Oropharynx as per pre-operative assessment

## 2022-03-13 NOTE — Discharge Instructions (Signed)
AMBULATORY SURGERY  ?DISCHARGE INSTRUCTIONS ? ? ?The drugs that you were given will stay in your system until tomorrow so for the next 24 hours you should not: ? ?Drive an automobile ?Make any legal decisions ?Drink any alcoholic beverage ? ? ?You may resume regular meals tomorrow.  Today it is better to start with liquids and gradually work up to solid foods. ? ?You may eat anything you prefer, but it is better to start with liquids, then soup and crackers, and gradually work up to solid foods. ? ? ?Please notify your doctor immediately if you have any unusual bleeding, trouble breathing, redness and pain at the surgery site, drainage, fever, or pain not relieved by medication. ? ? ? ?Additional Instructions: ? ? ? ?Please contact your physician with any problems or Same Day Surgery at [phone], Monday through Friday 6 am to 4 pm, or [facility] [place] [facility] number at [phone].  ?

## 2022-03-13 NOTE — H&P (Signed)
Sabrina Oneill [id number] Dec 25, 1970     HPI: Healthy 51 year old with recently identified left breast cancer and benign fibroadenoma.  Desires breast conservation.  Tolerated sentinel lymph node injection well.  Wire localization for the fibroadenoma was completed by radiology.  Facility-Administered Medications Prior to Admission  Medication Dose Route Frequency Provider Last Rate Last Admin   betamethasone acetate-betamethasone sodium phosphate (CELESTONE) injection 3 mg  3 mg Intra-articular Once Edrick Kins, DPM       Medications Prior to Admission  Medication Sig Dispense Refill Last Dose   ibuprofen (ADVIL) 800 MG tablet Take 1 tablet (800 mg total) by mouth every 8 (eight) hours as needed. 90 tablet 1 Past Month   Multiple Vitamin (MULTIVITAMIN) capsule Take 1 capsule by mouth daily.    Past Week   Multiple Vitamins-Minerals (EMERGEN-C IMMUNE PO) Take 1 packet by mouth daily.   Past Week   naproxen sodium (ANAPROX) 550 MG tablet Take 550 mg by mouth 2 (two) times daily as needed.    Past Month   conjugated estrogens (PREMARIN) vaginal cream Place 0.5 g vaginally once daily for two weeks, every other day for two weeks, then twice a week thereafter. (Patient not taking: Reported on 03/03/2022) 30 g 3    COVID-19 At Home Antigen Test (CARESTART COVID-19 HOME TEST) KIT use as directed within package instruction 2 kit 0    lidocaine-prilocaine (EMLA) cream Apply to areola one hour prior to arrival day of procedure. Cover with saran wrap. (Patient not taking: Reported on 02/24/2022) 30 g 0    meloxicam (MOBIC) 15 MG tablet Take 1 tablet (15 mg total) by mouth daily. (Patient not taking: Reported on 02/24/2022) 30 tablet 1    methylPREDNISolone (MEDROL DOSEPAK) 4 MG TBPK tablet 6 day dose pack - take as directed (Patient not taking: Reported on 02/24/2022) 21 tablet 0    UNABLE TO FIND Take 85 mg by mouth daily. Hema-plex 85 mg (Patient not taking: Reported on 02/24/2022)      No Known  Allergies Past Medical History:  Diagnosis Date   Anemia    Cancer ([place])    left beast   Past Surgical History:  Procedure Laterality Date   ABDOMINAL HYSTERECTOMY     BREAST BIOPSY Left 02/09/2022   [place] bx 2:00 7cmfn heart marker, path pending   BREAST BIOPSY Left 02/09/2022   [place] bx, 1:00 10cmfn coil marker, path pending   CESAREAN SECTION     COLONOSCOPY W/ POLYPECTOMY  2021   DILATION AND CURETTAGE OF UTERUS     2001   WISDOM TOOTH EXTRACTION     Social History   Socioeconomic History   Marital status: Married    Spouse name: Saralyn Pilar   Number of children: 2   Years of education: Not on file   Highest education level: Not on file  Occupational History   Not on file  Tobacco Use   Smoking status: Never   Smokeless tobacco: Never  Substance and Sexual Activity   Alcohol use: Not Currently    Alcohol/week: 0.0 standard drinks   Drug use: Not Currently   Sexual activity: Not on file  Other Topics Concern   Not on file  Social History Narrative   Not on file   Social Determinants of Health   Financial Resource Strain: Not on file  Food Insecurity: Not on file  Transportation Needs: Not on file  Physical Activity: Not on file  Stress: Not on file  Social Connections: Not on  file  Intimate Partner Violence: Not on file   Social History   Social History Narrative   Not on file     ROS: Negative.     PE: HEENT: Negative. Lungs: Clear. Cardio: RR.  Assessment/Plan:  Proceed with planned left breast cancer removal and sentinel node exam.  [facility]st Gleason [facility] 03/13/2022

## 2022-03-13 NOTE — Anesthesia Preprocedure Evaluation (Addendum)
Anesthesia Evaluation  Patient identified by MRN, date of birth, ID band Patient awake    Reviewed: Allergy & Precautions, NPO status , Patient's Chart, lab work & pertinent test results  History of Anesthesia Complications Negative for: history of anesthetic complications  Airway Mallampati: II  TM Distance: >3 FB Neck ROM: full    Dental  (+) Chipped   Pulmonary neg pulmonary ROS, neg shortness of breath,    Pulmonary exam normal        Cardiovascular Exercise Tolerance: Good (-) angina(-) Past MI negative cardio ROS Normal cardiovascular exam     Neuro/Psych negative neurological ROS  negative psych ROS   GI/Hepatic negative GI ROS, Neg liver ROS, neg GERD  ,  Endo/Other  negative endocrine ROS  Renal/GU      Musculoskeletal   Abdominal   Peds  Hematology negative hematology ROS (+)   Anesthesia Other Findings Past Medical History: No date: Anemia No date: Cancer [facility])     Comment:  left beast  Past Surgical History: No date: ABDOMINAL HYSTERECTOMY 02/09/2022: BREAST BIOPSY; Left     Comment:  [place] bx 2:00 7cmfn heart marker, path pending 02/09/2022: BREAST BIOPSY; Left     Comment:  [place] bx, 1:00 10cmfn coil marker, path pending No date: CESAREAN SECTION 2021: COLONOSCOPY W/ POLYPECTOMY No date: DILATION AND CURETTAGE OF UTERUS     Comment:  2001 No date: WISDOM TOOTH EXTRACTION  BMI    Body Mass Index: 36.43 kg/m      Reproductive/Obstetrics negative OB ROS                             Anesthesia Physical Anesthesia Plan  ASA: 2  Anesthesia Plan: General ETT   Post-op Pain Management:    Induction: Intravenous  PONV Risk Score and Plan: Dexamethasone, Ondansetron, Midazolam and Treatment may vary due to age or medical condition  Airway Management Planned: Oral ETT  Additional Equipment:   Intra-op Plan:   Post-operative Plan: Extubation in OR  Informed  Consent: I have reviewed the patients History and Physical, chart, labs and discussed the procedure including the risks, benefits and alternatives for the proposed anesthesia with the patient or authorized representative who has indicated his/her understanding and acceptance.     Dental Advisory Given  Plan Discussed with: Anesthesiologist, CRNA and Surgeon  Anesthesia Plan Comments: (Patient consented for risks of anesthesia including but not limited to:  - adverse reactions to medications - damage to eyes, teeth, lips or other oral mucosa - nerve damage due to positioning  - sore throat or hoarseness - Damage to heart, brain, nerves, lungs, other parts of body or loss of life  Patient voiced understanding.)       Anesthesia Quick Evaluation

## 2022-03-14 ENCOUNTER — Other Ambulatory Visit: Payer: Self-pay | Admitting: Pathology

## 2022-03-14 ENCOUNTER — Encounter: Payer: Self-pay | Admitting: General Surgery

## 2022-03-14 LAB — SURGICAL PATHOLOGY

## 2022-03-24 ENCOUNTER — Inpatient Hospital Stay: Payer: 59 | Attending: Oncology | Admitting: Oncology

## 2022-03-24 ENCOUNTER — Ambulatory Visit
Admission: RE | Admit: 2022-03-24 | Discharge: 2022-03-24 | Disposition: A | Discharge status: Home / Self Care | Payer: 59 | Source: Ambulatory Visit | Attending: Radiation Oncology | Admitting: Radiation Oncology

## 2022-03-24 ENCOUNTER — Encounter: Payer: Self-pay | Admitting: Oncology

## 2022-03-24 ENCOUNTER — Encounter: Payer: Self-pay | Admitting: Radiation Oncology

## 2022-03-24 VITALS — BP 104/76 | HR 57 | Temp 97.1°F | Resp 18 | Wt 196.9 lb

## 2022-03-24 VITALS — BP 104/68 | HR 61 | Temp 98.5°F | Resp 12 | Ht 61.5 in | Wt 197.2 lb

## 2022-03-24 DIAGNOSIS — Z806 Family history of leukemia: Secondary | ICD-10-CM | POA: Insufficient documentation

## 2022-03-24 DIAGNOSIS — Z170 Estrogen receptor positive status [ER+]: Secondary | ICD-10-CM | POA: Insufficient documentation

## 2022-03-24 DIAGNOSIS — C50412 Malignant neoplasm of upper-outer quadrant of left female breast: Secondary | ICD-10-CM | POA: Insufficient documentation

## 2022-03-24 DIAGNOSIS — Z862 Personal history of diseases of the blood and blood-forming organs and certain disorders involving the immune mechanism: Secondary | ICD-10-CM | POA: Diagnosis not present

## 2022-03-24 DIAGNOSIS — Z803 Family history of malignant neoplasm of breast: Secondary | ICD-10-CM | POA: Insufficient documentation

## 2022-03-24 DIAGNOSIS — M8580 Other specified disorders of bone density and structure, unspecified site: Secondary | ICD-10-CM | POA: Insufficient documentation

## 2022-03-24 DIAGNOSIS — Z7189 Other specified counseling: Secondary | ICD-10-CM | POA: Diagnosis not present

## 2022-03-24 NOTE — Progress Notes (Signed)
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 Grayland Ormond in the past.  She recently underwent a screening mammogram in March 2023 which showed possible distortion in the left breast.  This was followed by diagnostic mammogram and ultrasound which showed hypoechoic mass 12 x 4 x 13 mm in the left breast at the 1 o'clock position.  There was another mass 6 x 8 x 8 mm at the 2 o'clock position 7 cm from the nipple.  No suspicious left axillary adenopathy.  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  The second breast mass was negative for malignancy and consistent with benign fibroadenoma.  Final pathology showed 9 mm grade 1 tubular carcinoma with negative margins ER/PR positive HER2 negative.4 sentinel lymph nodes negative for malignancy.  Interval history-patient is healing well from her lumpectomy.  She does report some soreness at the site but otherwise denies other complaints at this time.  ECOG PS- 0 Pain scale- 0   Review of systems- Review of Systems  Constitutional:  Negative for chills, fever, malaise/fatigue and weight loss.  HENT:  Negative for congestion, ear discharge and nosebleeds.   Eyes:  Negative for blurred vision.  Respiratory:  Negative for cough, hemoptysis,  sputum production, shortness of breath and wheezing.   Cardiovascular:  Negative for chest pain, palpitations, orthopnea and claudication.  Gastrointestinal:  Negative for abdominal pain, blood in stool, constipation, diarrhea, heartburn, melena, nausea and vomiting.  Genitourinary:  Negative for dysuria, flank pain, frequency, hematuria and urgency.  Musculoskeletal:  Negative for back pain, joint pain and myalgias.  Skin:  Negative for rash.  Neurological:  Negative for dizziness, tingling, focal weakness, seizures, weakness and headaches.  Endo/Heme/Allergies:  Does not bruise/bleed easily.  Psychiatric/Behavioral:  Negative for depression and suicidal ideas. The patient does not have insomnia.       No Known Allergies   Past Medical History:  Diagnosis Date   Anemia    Cancer ([place])    left beast     Past Surgical History:  Procedure Laterality Date   ABDOMINAL HYSTERECTOMY     AXILLARY SENTINEL NODE BIOPSY Left 03/13/2022   Procedure: AXILLARY SENTINEL NODE BIOPSY;  Surgeon: Robert Bellow, MD;  Location: [facility] ORS;  Service: General;  Laterality: Left;   BREAST BIOPSY Left 02/09/2022   [place] bx 2:00 7cmfn heart marker, path pending   BREAST BIOPSY Left 02/09/2022   [place] bx, 1:00 10cmfn coil marker, path pending   BREAST LUMPECTOMY WITH NEEDLE LOCALIZATION Left 03/13/2022   Procedure: BREAST LUMPECTOMY WITH NEEDLE LOCALIZATION;  Surgeon: Robert Bellow, MD;  Location: [facility] ORS;  Service: General;  Laterality: Left;   CESAREAN SECTION     COLONOSCOPY W/ POLYPECTOMY  2021   DILATION AND CURETTAGE OF UTERUS     2001   WISDOM TOOTH EXTRACTION  Social History   Socioeconomic History   Marital status: Married    Spouse name: Saralyn Pilar   Number of children: 2   Years of education: Not on file   Highest education level: Not on file  Occupational History   Not on file  Tobacco Use   Smoking status: Never   Smokeless tobacco: Never  Substance and Sexual Activity    Alcohol use: Not Currently    Alcohol/week: 0.0 standard drinks of alcohol   Drug use: Not Currently   Sexual activity: Not on file  Other Topics Concern   Not on file  Social History Narrative   Not on file   Social Determinants of Health   Financial Resource Strain: Not on file  Food Insecurity: Not on file  Transportation Needs: Not on file  Physical Activity: Not on file  Stress: Not on file  Social Connections: Not on file  Intimate Partner Violence: Not on file    Family History  Problem Relation Age of Onset   Multiple myeloma Mother    Breast cancer Maternal Aunt    Breast cancer Paternal Grandmother        in her 35's     Current Outpatient Medications:    COVID-19 At Home Antigen Test (CARESTART COVID-19 HOME TEST) KIT, use as directed within package instruction, Disp: 2 kit, Rfl: 0   HYDROcodone-acetaminophen (NORCO/VICODIN) 5-325 MG tablet, Take 1 tablet by mouth every 4 (four) hours as needed for moderate pain., Disp: 12 tablet, Rfl: 0   ibuprofen (ADVIL) 800 MG tablet, Take 1 tablet (800 mg total) by mouth every 8 (eight) hours as needed., Disp: 90 tablet, Rfl: 1   Multiple Vitamin (MULTIVITAMIN) capsule, Take 1 capsule by mouth daily. , Disp: , Rfl:    Multiple Vitamins-Minerals (EMERGEN-C IMMUNE PO), Take 1 packet by mouth daily., Disp: , Rfl:    naproxen sodium (ANAPROX) 550 MG tablet, Take 550 mg by mouth 2 (two) times daily as needed. , Disp: , Rfl:   Current Facility-Administered Medications:    betamethasone acetate-betamethasone sodium phosphate (CELESTONE) injection 3 mg, 3 mg, Intra-articular, Once, Edrick Kins, DPM  Physical exam:  Vitals:   03/24/22 1037  BP: 104/76  Pulse: (!) 57  Resp: 18  Temp: (!) 97.1 F (36.2 C)  SpO2: 100%  Weight: 196 lb 14.4 oz (89.3 kg)   Physical Exam Cardiovascular:     Rate and Rhythm: Normal rate and regular rhythm.     Heart sounds: Normal heart sounds.  Pulmonary:     Effort: Pulmonary effort is  normal.  Skin:    General: Skin is warm and dry.  Neurological:     Mental Status: She is alert and oriented to person, place, and time.         Latest Ref Rng & Units 03/13/2022    9:47 AM  CMP  Glucose 70 - 99 mg/dL 94   BUN 6 - 20 mg/dL 10   Creatinine 0.44 - 1.00 mg/dL 0.83   Sodium 135 - 145 mmol/L 136   Potassium 3.5 - 5.1 mmol/L 3.8   Chloride 98 - 111 mmol/L 109   CO2 22 - 32 mmol/L 21   Calcium 8.9 - 10.3 mg/dL 9.0       Latest Ref Rng & Units 03/13/2022    9:47 AM  CBC  WBC 4.0 - 10.5 K/uL 4.6   Hemoglobin 12.0 - 15.0 g/dL 12.4   Hematocrit 36.0 - 46.0 % 37.6   Platelets 150 -  400 K/uL 192     No images are attached to the encounter.  MM Breast Surgical Specimen  Result Date: 03/13/2022 CLINICAL DATA:  51 year old female for left breast lumpectomy. EXAM: SPECIMEN RADIOGRAPH OF THE LEFT BREAST COMPARISON:  None Available. FINDINGS: Status post excision of the left breast. The wire tip and coil shaped biopsy marker clip are present and are marked for pathology. An additional specimen with a heart shaped biopsy marking clip is also present. IMPRESSION: Specimen radiograph of the left breast. Electronically Signed   By: Kristopher Oppenheim M.D.   On: 03/13/2022 11:34  NM Sentinel Node Inj-No Rpt (Breast)  Result Date: 03/13/2022 Sulfur Colloid was injected by the Nuclear Medicine Technologist for sentinel lymph node localization.   MM LT PLC BREAST LOC DEV   1ST LESION  INC MAMMO GUIDE  Result Date: 03/13/2022 CLINICAL DATA:  51 year old female with recently biopsied left breast grade 1 invasive mammary carcinoma (heart clip) and fragments of benign fibroadenoma (coil clip). The patient is requesting removal of both carcinoma and the fibroadenoma. Ordering surgeon Dr. Bary Castilla has requested needle localization of the benign fibroadenoma (coil clip) only at this time. Per Dr. Bary Castilla, the mammary carcinoma at the location of the heart clip will be directly localized in real-time by  ultrasound guidance at the time of surgery. He is not requesting needle localization of the carcinoma (heart clip) at this time. EXAM: NEEDLE LOCALIZATION OF THE LEFT BREAST WITH MAMMO GUIDANCE COMPARISON:  Prior studies. PROCEDURE: Patient presents for needle localization prior to left breast lumpectomy. I met with the patient and we discussed the procedure of needle localization including benefits and alternatives. We discussed the high likelihood of a successful procedure. We discussed the risks of the procedure, including infection, bleeding, tissue injury, and further surgery. Informed, written consent was given. The usual time-out protocol was performed immediately prior to the procedure. Using mammographic guidance, sterile technique, 1% lidocaine and a 5 inch modified Kopans needle, the coil clip was localized using lateral approach. The images were marked for Dr. Bary Castilla. IMPRESSION: Needle localization left breast. No apparent complications. Please note, only the site of the benign fibroadenoma (coil clip) was localized at this time per request of Dr. Bary Castilla. The carcinoma (heart clip) will be directly localized in real-time by ultrasound guidance at the time of surgery. Electronically Signed   By: Kristopher Oppenheim M.D.   On: 03/13/2022 08:48  DG Bone Density  Result Date: 02/27/2022 EXAM: DUAL X-RAY ABSORPTIOMETRY (DXA) FOR BONE MINERAL DENSITY IMPRESSION: Your patient Zorianna Taliaferro completed a BMD test on 02/27/2022 using the Rodessa (software version: 14.10) manufactured by [employer]. The following summarizes the results of our evaluation. Technologist:VLM PATIENT BIOGRAPHICAL: Name: Takyra, Cantrall Patient ID: [id number] Birth Date: Aug 07, 1971 Height: 61.5 in. Gender: Female Exam Date: 02/27/2022 Weight: 197.0 lbs. Indications: History of Breast Cancer, Hysterectomy, Postmenopausal Fractures: Treatments: DENSITOMETRY RESULTS: Site      Region    Measured Date Measured  Age WHO Classification Young Adult T-score BMD         %Change vs. Previous Significant Change (*) AP Spine L2-L4 02/27/2022 52.5 Osteopenia -2.1 0.954 g/cm2 - - DualFemur Neck Left 02/27/2022 52.5 Osteopenia -1.2 0.876 g/cm2 - - ASSESSMENT: The BMD measured at AP Spine L2-L4 is 0.954 g/cm2 with a T-score of -2.1. This patient is considered osteopenic according to [facility]) criteria. L- 1 was excluded due to degenerative changes. The scan quality is good. World Pharmacologist (  WHO) criteria for post-menopausal, Caucasian Women: Normal:                   T-score at or above -1 SD Osteopenia/low bone mass: T-score between -1 and -2.5 SD Osteoporosis:             T-score at or below -2.5 SD RECOMMENDATIONS: 1. All patients should optimize calcium and vitamin D intake. 2. Consider FDA-approved medical therapies in postmenopausal women and men aged 70 years and older, based on the following: a. A hip or vertebral(clinical or morphometric) fracture b. T-score < -2.5 at the femoral neck or spine after appropriate evaluation to exclude secondary causes c. Low bone mass (T-score between -1.0 and -2.5 at the femoral neck or spine) and a 10-year probability of a hip fracture > 3% or a 10-year probability of a major osteoporosis-related fracture > 20% based on the [place]-adapted WHO algorithm 3. Clinician judgment and/or patient preferences may indicate treatment for people with 10-year fracture probabilities above or below these levels FOLLOW-UP: People with diagnosed cases of osteoporosis or at high risk for fracture should have regular bone mineral density tests. For patients eligible for Medicare, routine testing is allowed once every 2 years. The testing frequency can be increased to one year for patients who have rapidly progressing disease, those who are receiving or discontinuing medical therapy to restore bone mass, or have additional risk factors. FRAX* RESULTS:  (version: 3.5) 10-year  Probability of Fracture1 Major Osteoporotic Fracture2 Hip Fracture 2.1% 0.1% Population: [place] (Black) Risk Factors: None Based on Femur (Left) Neck BMD 1 -The 10-year probability of fracture may be lower than reported if the patient has received treatment. 2 -Major Osteoporotic Fracture: Clinical Spine, Forearm, Hip or Shoulder *FRAX is a Materials engineer of the [employer] for Metabolic Bone Disease, a [place] (WHO) [facility]. ASSESSMENT: The probability of a major osteoporotic fracture is 2.1% within the next ten years. The probability of a hip fracture is 0.1% within the next ten years. Electronically Signed   By: Marlaine Hind M.D.   On: 02/27/2022 10:17     Assessment and plan- Patient is a 51 y.o. female with history of pathological prognostic stage Ia invasive mammary carcinoma of the left breast pT1b pN0 cM0 ER/PR positive HER2 negative here to discuss final pathology results and further management  Given that her final pathology shows a tumor that is less than 10 mm and grade 1 tubular histology I would not recommend Oncotype testing to determine if she would benefit from adjuvant chemotherapy or not.  As such she does not require adjuvant chemotherapy and can proceed with adjuvant radiation treatment at this time.  Patient is s/p hysterectomy and I did obtain baseline hormone levels which shows estradiol levels which are somewhat low at 27.5 but FSH is not conclusively high.  It is mildly elevated at 33.3.  As such I will treat her as if she is premenopausal and start her on tamoxifen.  Discussed risks and benefits of tamoxifen including all but not limited to hot flashes, fatigue, arthralgias, risk of cataracts and DVT.  Treatment will be given with a curative intent.  I will plan to repeat her hormone levels sometime in the next 6 to 8 months and if [facility] levels are consistently going up I will switch her to an AI at that time.  Patient did  have a baseline bone density scan which showed osteopenia with a T score of -2.1 in  the AP spine.  A 10-year probability of a major osteoporotic fracture was less than 20% and hip fracture less than 3%. She does not require adjuvant bisphosphonates at this time   Cancer Staging  Malignant neoplasm of upper-outer quadrant of left breast in female, estrogen receptor positive ([place]) Staging form: Breast, AJCC 8th Edition - Clinical stage from 02/26/2022: Stage IA (cT1b, cN0, cM0, G1, ER+, PR+, HER2-) - Signed by Sindy Guadeloupe, MD on 02/26/2022 Histologic grading system: 3 grade system - Pathologic stage from 03/24/2022: Stage IA (pT1b, pN0, cM0, G1, ER+, PR+, HER2-) - Signed by Sindy Guadeloupe, MD on 03/24/2022 Multigene prognostic tests performed: None Histologic grading system: 3 grade system     Visit Diagnosis 1. Goals of care, counseling/discussion   2. Malignant neoplasm of upper-outer quadrant of left breast in female, estrogen receptor positive ([place])      Dr. Randa Evens, MD, MPH [facility] at [facility] [phone] 03/24/2022 4:56 PM

## 2022-03-24 NOTE — Consult Note (Signed)
NEW PATIENT EVALUATION  Name: Sabrina Oneill  MRN: [id number]  Date:   03/24/2022     DOB: 1971-02-04   This 51 y.o. female patient presents to the clinic for initial evaluation of stage Ia (T1b N0 M0) invasive mammary carcinoma of left breast status post wide local excision and sentinel node biopsy ER/PR positive HER2/neu not overexpressed.  REFERRING PHYSICIAN: Derinda Late, MD  CHIEF COMPLAINT:  Chief Complaint  Patient presents with   Breast Cancer    DIAGNOSIS: The encounter diagnosis was Malignant neoplasm of upper-outer quadrant of left breast in female, estrogen receptor positive ([place]).   PREVIOUS INVESTIGATIONS:  Mammogram and ultrasound reviewed Pathology reports reviewed Clinical notes reviewed  HPI: Patient is a 51 year old female who presented with some tenderness in her left breast.  She underwent mammogram and ultrasound showing a lesion at 2:00 7 cm from nipple irregular hypoechoic mass with irregular margins measuring 8 mm in greatest dimension.  Also during ultrasound she is noted to have an incidental oval circumscribed hypoechoic mass in the left breast at the 1 o'clock position 10 cm from the nipple measuring 12 x 13 mm.  Targeted ultrasound-guided biopsy showed the 2:00 mass to be invasive mammary carcinoma the well-circumscribed mass at 1:00 was a fibroadenoma.  Patient underwent wide local excision of both masses.  The mass at 2:00 was a tubular carcinoma well differentiated grade 1 sentinel nodes 4 were all negative.  Margins were clear at 3 mm.  The tumor size was 9 mm.  The other lesion at the 1 o'clock position was a fibroadenoma.  Patient has done well postoperatively.  She specifically denies breast tenderness cough or bone pain.  She is seeing Dr. Janese Banks today although no Oncotype DX will be ordered.  PLANNED TREATMENT REGIMEN: Left hypofractionated whole breast radiation  PAST MEDICAL HISTORY:  has a past medical history of Anemia and Cancer ([place]).     PAST SURGICAL HISTORY:  Past Surgical History:  Procedure Laterality Date   ABDOMINAL HYSTERECTOMY     AXILLARY SENTINEL NODE BIOPSY Left 03/13/2022   Procedure: AXILLARY SENTINEL NODE BIOPSY;  Surgeon: Robert Bellow, MD;  Location: [facility] ORS;  Service: General;  Laterality: Left;   BREAST BIOPSY Left 02/09/2022   [place] bx 2:00 7cmfn heart marker, path pending   BREAST BIOPSY Left 02/09/2022   [place] bx, 1:00 10cmfn coil marker, path pending   BREAST LUMPECTOMY WITH NEEDLE LOCALIZATION Left 03/13/2022   Procedure: BREAST LUMPECTOMY WITH NEEDLE LOCALIZATION;  Surgeon: Robert Bellow, MD;  Location: [facility] ORS;  Service: General;  Laterality: Left;   CESAREAN SECTION     COLONOSCOPY W/ POLYPECTOMY  2021   DILATION AND CURETTAGE OF UTERUS     2001   WISDOM TOOTH EXTRACTION      FAMILY HISTORY: family history includes Breast cancer in her maternal aunt and paternal grandmother; Multiple myeloma in her mother.  SOCIAL HISTORY:  reports that she has never smoked. She has never used smokeless tobacco. She reports that she does not currently use alcohol. She reports that she does not currently use drugs.  ALLERGIES: Patient has no known allergies.  MEDICATIONS:  Current Outpatient Medications  Medication Sig Dispense Refill   COVID-19 At Home Antigen Test (CARESTART COVID-19 HOME TEST) KIT use as directed within package instruction 2 kit 0   HYDROcodone-acetaminophen (NORCO/VICODIN) 5-325 MG tablet Take 1 tablet by mouth every 4 (four) hours as needed for moderate pain. 12 tablet 0   ibuprofen (ADVIL) 800 MG tablet  Take 1 tablet (800 mg total) by mouth every 8 (eight) hours as needed. 90 tablet 1   Multiple Vitamin (MULTIVITAMIN) capsule Take 1 capsule by mouth daily.      Multiple Vitamins-Minerals (EMERGEN-C IMMUNE PO) Take 1 packet by mouth daily.     naproxen sodium (ANAPROX) 550 MG tablet Take 550 mg by mouth 2 (two) times daily as needed.      Current Facility-Administered Medications   Medication Dose Route Frequency Provider Last Rate Last Admin   betamethasone acetate-betamethasone sodium phosphate (CELESTONE) injection 3 mg  3 mg Intra-articular Once Evans, Dorathy Daft, DPM        ECOG PERFORMANCE STATUS:  0 - Asymptomatic  REVIEW OF SYSTEMS: Patient denies any weight loss, fatigue, weakness, fever, chills or night sweats. Patient denies any loss of vision, blurred vision. Patient denies any ringing  of the ears or hearing loss. No irregular heartbeat. Patient denies heart murmur or history of fainting. Patient denies any chest pain or pain radiating to her upper extremities. Patient denies any shortness of breath, difficulty breathing at night, cough or hemoptysis. Patient denies any swelling in the lower legs. Patient denies any nausea vomiting, vomiting of blood, or coffee ground material in the vomitus. Patient denies any stomach pain. Patient states has had normal bowel movements no significant constipation or diarrhea. Patient denies any dysuria, hematuria or significant nocturia. Patient denies any problems walking, swelling in the joints or loss of balance. Patient denies any skin changes, loss of hair or loss of weight. Patient denies any excessive worrying or anxiety or significant depression. Patient denies any problems with insomnia. Patient denies excessive thirst, polyuria, polydipsia. Patient denies any swollen glands, patient denies easy bruising or easy bleeding. Patient denies any recent infections, allergies or URI. Patient "s visual fields have not changed significantly in recent time.   PHYSICAL EXAM: BP 104/68 (BP Location: Right Arm, Patient Position: Sitting, Cuff Size: Large)   Pulse 61   Temp 98.5 F (36.9 C) (Tympanic)   Resp 12   Ht 5' 1.5" (1.562 m) Comment: stated wt  Wt 197 lb 3.2 oz (89.4 kg)   LMP 10/13/2019   BMI 36.66 kg/m  She status post wide local excision of the left breast incision is healing well.  No dominant masses noted in either  breast no axillary or supraclavicular adenopathy is identified.  Well-developed well-nourished patient in NAD. HEENT reveals PERLA, EOMI, discs not visualized.  Oral cavity is clear. No oral mucosal lesions are identified. Neck is clear without evidence of cervical or supraclavicular adenopathy. Lungs are clear to A&P. Cardiac examination is essentially unremarkable with regular rate and rhythm without murmur rub or thrill. Abdomen is benign with no organomegaly or masses noted. Motor sensory and DTR levels are equal and symmetric in the upper and lower extremities. Cranial nerves II through XII are grossly intact. Proprioception is intact. No peripheral adenopathy or edema is identified. No motor or sensory levels are noted. Crude visual fields are within normal range.  LABORATORY DATA: Pathology reports reviewed    RADIOLOGY RESULTS: Mammogram and ultrasound reviewed   IMPRESSION: Stage Ia ER/PR positive invasive mammary carcinoma of the left breast status post wide local excision in 51 year old female  PLAN: At this time elected ahead with hypofractionated course of whole breast radiation over 3 weeks to her left breast.  Would also boost her scar another 1000 centigrade based on her close margin.  Risks and benefits of treatment including skin reaction fatigue alteration blood counts  possible inclusion of superficial lung all were reviewed in detail with the patient.  I personally set up and ordered CT simulation.  Patient also will be candidate for endocrine therapy after completion of radiation.  Patient comprehends my recommendations well.  I would like to take this opportunity to thank you for allowing me to participate in the care of your patient.Noreene Filbert, MD

## 2022-03-25 ENCOUNTER — Other Ambulatory Visit: Payer: Self-pay | Admitting: *Deleted

## 2022-03-25 MED ORDER — TAMOXIFEN CITRATE 20 MG PO TABS
20.0000 mg | ORAL_TABLET | Freq: Every day | ORAL | 3 refills | Status: DC
  Filled 2022-03-25: qty 30, 30d supply, fill #0

## 2022-03-26 ENCOUNTER — Other Ambulatory Visit: Payer: Self-pay

## 2022-03-27 ENCOUNTER — Other Ambulatory Visit: Payer: Self-pay

## 2022-03-29 ENCOUNTER — Encounter: Payer: Self-pay | Admitting: Oncology

## 2022-03-30 ENCOUNTER — Encounter: Payer: Self-pay | Admitting: Oncology

## 2022-04-02 ENCOUNTER — Other Ambulatory Visit: Payer: Self-pay

## 2022-04-04 ENCOUNTER — Ambulatory Visit
Admission: RE | Admit: 2022-04-04 | Discharge: 2022-04-04 | Disposition: A | Discharge status: Home / Self Care | Payer: 59 | Source: Ambulatory Visit | Attending: Radiation Oncology | Admitting: Radiation Oncology

## 2022-04-04 ENCOUNTER — Other Ambulatory Visit: Payer: Self-pay

## 2022-04-04 DIAGNOSIS — C50412 Malignant neoplasm of upper-outer quadrant of left female breast: Secondary | ICD-10-CM | POA: Insufficient documentation

## 2022-04-04 DIAGNOSIS — Z170 Estrogen receptor positive status [ER+]: Secondary | ICD-10-CM | POA: Insufficient documentation

## 2022-04-06 DIAGNOSIS — Z170 Estrogen receptor positive status [ER+]: Secondary | ICD-10-CM | POA: Diagnosis not present

## 2022-04-06 DIAGNOSIS — C50412 Malignant neoplasm of upper-outer quadrant of left female breast: Secondary | ICD-10-CM | POA: Diagnosis not present

## 2022-04-07 ENCOUNTER — Other Ambulatory Visit: Payer: Self-pay | Admitting: *Deleted

## 2022-04-07 DIAGNOSIS — Z170 Estrogen receptor positive status [ER+]: Secondary | ICD-10-CM

## 2022-04-07 DIAGNOSIS — C50412 Malignant neoplasm of upper-outer quadrant of left female breast: Secondary | ICD-10-CM

## 2022-04-12 ENCOUNTER — Ambulatory Visit
Admission: RE | Admit: 2022-04-12 | Discharge: 2022-04-12 | Disposition: A | Discharge status: Home / Self Care | Payer: 59 | Source: Ambulatory Visit | Attending: Radiation Oncology | Admitting: Radiation Oncology

## 2022-04-12 DIAGNOSIS — C50412 Malignant neoplasm of upper-outer quadrant of left female breast: Secondary | ICD-10-CM | POA: Insufficient documentation

## 2022-04-12 DIAGNOSIS — Z170 Estrogen receptor positive status [ER+]: Secondary | ICD-10-CM | POA: Insufficient documentation

## 2022-04-13 ENCOUNTER — Ambulatory Visit
Admission: RE | Admit: 2022-04-13 | Discharge: 2022-04-13 | Disposition: A | Discharge status: Home / Self Care | Payer: 59 | Source: Ambulatory Visit | Attending: Radiation Oncology | Admitting: Radiation Oncology

## 2022-04-13 ENCOUNTER — Other Ambulatory Visit: Payer: Self-pay

## 2022-04-13 DIAGNOSIS — C50412 Malignant neoplasm of upper-outer quadrant of left female breast: Secondary | ICD-10-CM | POA: Diagnosis not present

## 2022-04-13 DIAGNOSIS — Z170 Estrogen receptor positive status [ER+]: Secondary | ICD-10-CM | POA: Diagnosis not present

## 2022-04-13 LAB — RAD ONC ARIA SESSION SUMMARY
Course Elapsed Days: 0
Plan Fractions Treated to Date: 1
Plan Prescribed Dose Per Fraction: 2.66 Gy
Plan Total Fractions Prescribed: 16
Plan Total Prescribed Dose: 42.56 Gy
Reference Point Dosage Given to Date: 2.66 Gy
Reference Point Session Dosage Given: 2.66 Gy
Session Number: 1

## 2022-04-14 ENCOUNTER — Ambulatory Visit
Admission: RE | Admit: 2022-04-14 | Discharge: 2022-04-14 | Disposition: A | Discharge status: Home / Self Care | Payer: 59 | Source: Ambulatory Visit | Attending: Radiation Oncology | Admitting: Radiation Oncology

## 2022-04-14 ENCOUNTER — Other Ambulatory Visit: Payer: Self-pay

## 2022-04-14 DIAGNOSIS — C50412 Malignant neoplasm of upper-outer quadrant of left female breast: Secondary | ICD-10-CM | POA: Diagnosis not present

## 2022-04-14 DIAGNOSIS — Z170 Estrogen receptor positive status [ER+]: Secondary | ICD-10-CM | POA: Diagnosis not present

## 2022-04-14 LAB — RAD ONC ARIA SESSION SUMMARY
Course Elapsed Days: 1
Plan Fractions Treated to Date: 2
Plan Prescribed Dose Per Fraction: 2.66 Gy
Plan Total Fractions Prescribed: 16
Plan Total Prescribed Dose: 42.56 Gy
Reference Point Dosage Given to Date: 5.32 Gy
Reference Point Session Dosage Given: 2.66 Gy
Session Number: 2

## 2022-04-17 ENCOUNTER — Other Ambulatory Visit: Payer: Self-pay

## 2022-04-17 ENCOUNTER — Ambulatory Visit
Admission: RE | Admit: 2022-04-17 | Discharge: 2022-04-17 | Disposition: A | Discharge status: Home / Self Care | Payer: 59 | Source: Ambulatory Visit | Attending: Radiation Oncology | Admitting: Radiation Oncology

## 2022-04-17 DIAGNOSIS — C50412 Malignant neoplasm of upper-outer quadrant of left female breast: Secondary | ICD-10-CM | POA: Diagnosis not present

## 2022-04-17 DIAGNOSIS — Z170 Estrogen receptor positive status [ER+]: Secondary | ICD-10-CM | POA: Diagnosis not present

## 2022-04-17 LAB — RAD ONC ARIA SESSION SUMMARY
Course Elapsed Days: 4
Plan Fractions Treated to Date: 3
Plan Prescribed Dose Per Fraction: 2.66 Gy
Plan Total Fractions Prescribed: 16
Plan Total Prescribed Dose: 42.56 Gy
Reference Point Dosage Given to Date: 7.98 Gy
Reference Point Session Dosage Given: 2.66 Gy
Session Number: 3

## 2022-04-18 ENCOUNTER — Ambulatory Visit
Admission: RE | Admit: 2022-04-18 | Discharge: 2022-04-18 | Disposition: A | Discharge status: Home / Self Care | Payer: 59 | Source: Ambulatory Visit | Attending: Radiation Oncology | Admitting: Radiation Oncology

## 2022-04-18 ENCOUNTER — Other Ambulatory Visit: Payer: Self-pay

## 2022-04-18 DIAGNOSIS — C50412 Malignant neoplasm of upper-outer quadrant of left female breast: Secondary | ICD-10-CM | POA: Diagnosis not present

## 2022-04-18 DIAGNOSIS — Z170 Estrogen receptor positive status [ER+]: Secondary | ICD-10-CM | POA: Diagnosis not present

## 2022-04-18 LAB — RAD ONC ARIA SESSION SUMMARY
Course Elapsed Days: 5
Plan Fractions Treated to Date: 4
Plan Prescribed Dose Per Fraction: 2.66 Gy
Plan Total Fractions Prescribed: 16
Plan Total Prescribed Dose: 42.56 Gy
Reference Point Dosage Given to Date: 10.64 Gy
Reference Point Session Dosage Given: 2.66 Gy
Session Number: 4

## 2022-04-19 ENCOUNTER — Ambulatory Visit
Admission: RE | Admit: 2022-04-19 | Discharge: 2022-04-19 | Disposition: A | Discharge status: Home / Self Care | Payer: 59 | Source: Ambulatory Visit | Attending: Radiation Oncology | Admitting: Radiation Oncology

## 2022-04-19 ENCOUNTER — Other Ambulatory Visit: Payer: Self-pay

## 2022-04-19 DIAGNOSIS — C50412 Malignant neoplasm of upper-outer quadrant of left female breast: Secondary | ICD-10-CM | POA: Diagnosis not present

## 2022-04-19 DIAGNOSIS — Z170 Estrogen receptor positive status [ER+]: Secondary | ICD-10-CM | POA: Diagnosis not present

## 2022-04-19 LAB — RAD ONC ARIA SESSION SUMMARY
Course Elapsed Days: 6
Plan Fractions Treated to Date: 5
Plan Prescribed Dose Per Fraction: 2.66 Gy
Plan Total Fractions Prescribed: 16
Plan Total Prescribed Dose: 42.56 Gy
Reference Point Dosage Given to Date: 13.3 Gy
Reference Point Session Dosage Given: 2.66 Gy
Session Number: 5

## 2022-04-20 ENCOUNTER — Other Ambulatory Visit: Payer: Self-pay

## 2022-04-20 ENCOUNTER — Ambulatory Visit
Admission: RE | Admit: 2022-04-20 | Discharge: 2022-04-20 | Disposition: A | Discharge status: Home / Self Care | Payer: 59 | Source: Ambulatory Visit | Attending: Radiation Oncology | Admitting: Radiation Oncology

## 2022-04-20 DIAGNOSIS — Z170 Estrogen receptor positive status [ER+]: Secondary | ICD-10-CM | POA: Diagnosis not present

## 2022-04-20 DIAGNOSIS — C50412 Malignant neoplasm of upper-outer quadrant of left female breast: Secondary | ICD-10-CM | POA: Diagnosis not present

## 2022-04-20 LAB — RAD ONC ARIA SESSION SUMMARY
Course Elapsed Days: 7
Plan Fractions Treated to Date: 6
Plan Prescribed Dose Per Fraction: 2.66 Gy
Plan Total Fractions Prescribed: 16
Plan Total Prescribed Dose: 42.56 Gy
Reference Point Dosage Given to Date: 15.96 Gy
Reference Point Session Dosage Given: 2.66 Gy
Session Number: 6

## 2022-04-21 ENCOUNTER — Other Ambulatory Visit: Payer: Self-pay

## 2022-04-21 ENCOUNTER — Ambulatory Visit
Admission: RE | Admit: 2022-04-21 | Discharge: 2022-04-21 | Disposition: A | Discharge status: Home / Self Care | Payer: 59 | Source: Ambulatory Visit | Attending: Radiation Oncology | Admitting: Radiation Oncology

## 2022-04-21 DIAGNOSIS — Z170 Estrogen receptor positive status [ER+]: Secondary | ICD-10-CM | POA: Diagnosis not present

## 2022-04-21 DIAGNOSIS — C50412 Malignant neoplasm of upper-outer quadrant of left female breast: Secondary | ICD-10-CM | POA: Diagnosis not present

## 2022-04-21 LAB — RAD ONC ARIA SESSION SUMMARY
Course Elapsed Days: 8
Plan Fractions Treated to Date: 7
Plan Prescribed Dose Per Fraction: 2.66 Gy
Plan Total Fractions Prescribed: 16
Plan Total Prescribed Dose: 42.56 Gy
Reference Point Dosage Given to Date: 18.62 Gy
Reference Point Session Dosage Given: 2.66 Gy
Session Number: 7

## 2022-04-24 ENCOUNTER — Ambulatory Visit
Admission: RE | Admit: 2022-04-24 | Discharge: 2022-04-24 | Disposition: A | Discharge status: Home / Self Care | Payer: 59 | Source: Ambulatory Visit | Attending: Radiation Oncology | Admitting: Radiation Oncology

## 2022-04-24 ENCOUNTER — Other Ambulatory Visit: Payer: Self-pay

## 2022-04-24 DIAGNOSIS — C50412 Malignant neoplasm of upper-outer quadrant of left female breast: Secondary | ICD-10-CM | POA: Diagnosis not present

## 2022-04-24 DIAGNOSIS — Z170 Estrogen receptor positive status [ER+]: Secondary | ICD-10-CM | POA: Diagnosis not present

## 2022-04-24 LAB — RAD ONC ARIA SESSION SUMMARY
Course Elapsed Days: 11
Plan Fractions Treated to Date: 8
Plan Prescribed Dose Per Fraction: 2.66 Gy
Plan Total Fractions Prescribed: 16
Plan Total Prescribed Dose: 42.56 Gy
Reference Point Dosage Given to Date: 21.28 Gy
Reference Point Session Dosage Given: 2.66 Gy
Session Number: 8

## 2022-04-25 ENCOUNTER — Inpatient Hospital Stay: Payer: 59 | Attending: Oncology

## 2022-04-25 ENCOUNTER — Other Ambulatory Visit: Payer: Self-pay

## 2022-04-25 ENCOUNTER — Ambulatory Visit
Admission: RE | Admit: 2022-04-25 | Discharge: 2022-04-25 | Disposition: A | Discharge status: Home / Self Care | Payer: 59 | Source: Ambulatory Visit | Attending: Radiation Oncology | Admitting: Radiation Oncology

## 2022-04-25 DIAGNOSIS — C50412 Malignant neoplasm of upper-outer quadrant of left female breast: Secondary | ICD-10-CM | POA: Insufficient documentation

## 2022-04-25 DIAGNOSIS — Z170 Estrogen receptor positive status [ER+]: Secondary | ICD-10-CM | POA: Diagnosis not present

## 2022-04-25 LAB — CBC
HCT: 41.4 % (ref 36.0–46.0)
Hemoglobin: 13.8 g/dL (ref 12.0–15.0)
MCH: 30.3 pg (ref 26.0–34.0)
MCHC: 33.3 g/dL (ref 30.0–36.0)
MCV: 91 fL (ref 80.0–100.0)
Platelets: 249 10*3/uL (ref 150–400)
RBC: 4.55 MIL/uL (ref 3.87–5.11)
RDW: 12.3 % (ref 11.5–15.5)
WBC: 4.1 10*3/uL (ref 4.0–10.5)
nRBC: 0 % (ref 0.0–0.2)

## 2022-04-25 LAB — RAD ONC ARIA SESSION SUMMARY
Course Elapsed Days: 12
Plan Fractions Treated to Date: 9
Plan Prescribed Dose Per Fraction: 2.66 Gy
Plan Total Fractions Prescribed: 16
Plan Total Prescribed Dose: 42.56 Gy
Reference Point Dosage Given to Date: 23.94 Gy
Reference Point Session Dosage Given: 2.66 Gy
Session Number: 9

## 2022-04-26 ENCOUNTER — Other Ambulatory Visit: Payer: Self-pay

## 2022-04-26 ENCOUNTER — Ambulatory Visit
Admission: RE | Admit: 2022-04-26 | Discharge: 2022-04-26 | Disposition: A | Discharge status: Home / Self Care | Payer: 59 | Source: Ambulatory Visit | Attending: Radiation Oncology | Admitting: Radiation Oncology

## 2022-04-26 DIAGNOSIS — C50412 Malignant neoplasm of upper-outer quadrant of left female breast: Secondary | ICD-10-CM | POA: Diagnosis not present

## 2022-04-26 DIAGNOSIS — Z170 Estrogen receptor positive status [ER+]: Secondary | ICD-10-CM | POA: Diagnosis not present

## 2022-04-26 LAB — RAD ONC ARIA SESSION SUMMARY
Course Elapsed Days: 13
Plan Fractions Treated to Date: 10
Plan Prescribed Dose Per Fraction: 2.66 Gy
Plan Total Fractions Prescribed: 16
Plan Total Prescribed Dose: 42.56 Gy
Reference Point Dosage Given to Date: 26.6 Gy
Reference Point Session Dosage Given: 2.66 Gy
Session Number: 10

## 2022-04-27 ENCOUNTER — Other Ambulatory Visit: Payer: Self-pay

## 2022-04-27 ENCOUNTER — Ambulatory Visit
Admission: RE | Admit: 2022-04-27 | Discharge: 2022-04-27 | Disposition: A | Discharge status: Home / Self Care | Payer: 59 | Source: Ambulatory Visit | Attending: Radiation Oncology | Admitting: Radiation Oncology

## 2022-04-27 DIAGNOSIS — Z170 Estrogen receptor positive status [ER+]: Secondary | ICD-10-CM | POA: Diagnosis not present

## 2022-04-27 DIAGNOSIS — C50412 Malignant neoplasm of upper-outer quadrant of left female breast: Secondary | ICD-10-CM | POA: Diagnosis not present

## 2022-04-27 LAB — RAD ONC ARIA SESSION SUMMARY
Course Elapsed Days: 14
Plan Fractions Treated to Date: 11
Plan Prescribed Dose Per Fraction: 2.66 Gy
Plan Total Fractions Prescribed: 16
Plan Total Prescribed Dose: 42.56 Gy
Reference Point Dosage Given to Date: 29.26 Gy
Reference Point Session Dosage Given: 2.66 Gy
Session Number: 11

## 2022-04-28 ENCOUNTER — Ambulatory Visit
Admission: RE | Admit: 2022-04-28 | Discharge: 2022-04-28 | Disposition: A | Discharge status: Home / Self Care | Payer: 59 | Source: Ambulatory Visit | Attending: Radiation Oncology | Admitting: Radiation Oncology

## 2022-04-28 ENCOUNTER — Other Ambulatory Visit: Payer: Self-pay

## 2022-04-28 DIAGNOSIS — C50412 Malignant neoplasm of upper-outer quadrant of left female breast: Secondary | ICD-10-CM | POA: Diagnosis not present

## 2022-04-28 DIAGNOSIS — Z170 Estrogen receptor positive status [ER+]: Secondary | ICD-10-CM | POA: Diagnosis not present

## 2022-04-28 LAB — RAD ONC ARIA SESSION SUMMARY
Course Elapsed Days: 15
Plan Fractions Treated to Date: 12
Plan Prescribed Dose Per Fraction: 2.66 Gy
Plan Total Fractions Prescribed: 16
Plan Total Prescribed Dose: 42.56 Gy
Reference Point Dosage Given to Date: 31.92 Gy
Reference Point Session Dosage Given: 2.66 Gy
Session Number: 12

## 2022-05-01 ENCOUNTER — Other Ambulatory Visit: Payer: Self-pay

## 2022-05-01 ENCOUNTER — Ambulatory Visit
Admission: RE | Admit: 2022-05-01 | Discharge: 2022-05-01 | Disposition: A | Discharge status: Home / Self Care | Payer: 59 | Source: Ambulatory Visit | Attending: Radiation Oncology | Admitting: Radiation Oncology

## 2022-05-01 DIAGNOSIS — Z170 Estrogen receptor positive status [ER+]: Secondary | ICD-10-CM | POA: Diagnosis not present

## 2022-05-01 DIAGNOSIS — C50412 Malignant neoplasm of upper-outer quadrant of left female breast: Secondary | ICD-10-CM | POA: Diagnosis not present

## 2022-05-01 LAB — RAD ONC ARIA SESSION SUMMARY
Course Elapsed Days: 18
Plan Fractions Treated to Date: 13
Plan Prescribed Dose Per Fraction: 2.66 Gy
Plan Total Fractions Prescribed: 16
Plan Total Prescribed Dose: 42.56 Gy
Reference Point Dosage Given to Date: 34.58 Gy
Reference Point Session Dosage Given: 2.66 Gy
Session Number: 13

## 2022-05-02 ENCOUNTER — Other Ambulatory Visit: Payer: Self-pay

## 2022-05-02 ENCOUNTER — Ambulatory Visit
Admission: RE | Admit: 2022-05-02 | Discharge: 2022-05-02 | Disposition: A | Discharge status: Home / Self Care | Payer: 59 | Source: Ambulatory Visit | Attending: Radiation Oncology | Admitting: Radiation Oncology

## 2022-05-02 DIAGNOSIS — C50412 Malignant neoplasm of upper-outer quadrant of left female breast: Secondary | ICD-10-CM | POA: Diagnosis not present

## 2022-05-02 DIAGNOSIS — Z170 Estrogen receptor positive status [ER+]: Secondary | ICD-10-CM | POA: Diagnosis not present

## 2022-05-02 LAB — RAD ONC ARIA SESSION SUMMARY
Course Elapsed Days: 19
Plan Fractions Treated to Date: 14
Plan Prescribed Dose Per Fraction: 2.66 Gy
Plan Total Fractions Prescribed: 16
Plan Total Prescribed Dose: 42.56 Gy
Reference Point Dosage Given to Date: 37.24 Gy
Reference Point Session Dosage Given: 2.66 Gy
Session Number: 14

## 2022-05-03 ENCOUNTER — Other Ambulatory Visit: Payer: Self-pay

## 2022-05-03 ENCOUNTER — Ambulatory Visit
Admission: RE | Admit: 2022-05-03 | Discharge: 2022-05-03 | Disposition: A | Discharge status: Home / Self Care | Payer: 59 | Source: Ambulatory Visit | Attending: Radiation Oncology | Admitting: Radiation Oncology

## 2022-05-03 DIAGNOSIS — Z170 Estrogen receptor positive status [ER+]: Secondary | ICD-10-CM | POA: Diagnosis not present

## 2022-05-03 DIAGNOSIS — C50412 Malignant neoplasm of upper-outer quadrant of left female breast: Secondary | ICD-10-CM | POA: Diagnosis not present

## 2022-05-03 LAB — RAD ONC ARIA SESSION SUMMARY
Course Elapsed Days: 20
Plan Fractions Treated to Date: 15
Plan Prescribed Dose Per Fraction: 2.66 Gy
Plan Total Fractions Prescribed: 16
Plan Total Prescribed Dose: 42.56 Gy
Reference Point Dosage Given to Date: 39.9 Gy
Reference Point Session Dosage Given: 2.66 Gy
Session Number: 15

## 2022-05-04 ENCOUNTER — Other Ambulatory Visit: Payer: Self-pay

## 2022-05-04 ENCOUNTER — Ambulatory Visit
Admission: RE | Admit: 2022-05-04 | Discharge: 2022-05-04 | Disposition: A | Discharge status: Home / Self Care | Payer: 59 | Source: Ambulatory Visit | Attending: Radiation Oncology | Admitting: Radiation Oncology

## 2022-05-04 DIAGNOSIS — Z170 Estrogen receptor positive status [ER+]: Secondary | ICD-10-CM | POA: Diagnosis not present

## 2022-05-04 DIAGNOSIS — C50412 Malignant neoplasm of upper-outer quadrant of left female breast: Secondary | ICD-10-CM | POA: Diagnosis not present

## 2022-05-04 LAB — RAD ONC ARIA SESSION SUMMARY
Course Elapsed Days: 21
Plan Fractions Treated to Date: 16
Plan Prescribed Dose Per Fraction: 2.66 Gy
Plan Total Fractions Prescribed: 16
Plan Total Prescribed Dose: 42.56 Gy
Reference Point Dosage Given to Date: 42.56 Gy
Reference Point Session Dosage Given: 2.66 Gy
Session Number: 16

## 2022-05-05 ENCOUNTER — Ambulatory Visit
Admission: RE | Admit: 2022-05-05 | Discharge: 2022-05-05 | Disposition: A | Discharge status: Home / Self Care | Payer: 59 | Source: Ambulatory Visit | Attending: Radiation Oncology | Admitting: Radiation Oncology

## 2022-05-05 ENCOUNTER — Other Ambulatory Visit: Payer: Self-pay | Admitting: *Deleted

## 2022-05-05 ENCOUNTER — Other Ambulatory Visit: Payer: Self-pay

## 2022-05-05 DIAGNOSIS — Z170 Estrogen receptor positive status [ER+]: Secondary | ICD-10-CM | POA: Diagnosis not present

## 2022-05-05 DIAGNOSIS — C50412 Malignant neoplasm of upper-outer quadrant of left female breast: Secondary | ICD-10-CM | POA: Diagnosis not present

## 2022-05-05 LAB — RAD ONC ARIA SESSION SUMMARY
Course Elapsed Days: 22
Plan Fractions Treated to Date: 1
Plan Prescribed Dose Per Fraction: 2 Gy
Plan Total Fractions Prescribed: 5
Plan Total Prescribed Dose: 10 Gy
Reference Point Dosage Given to Date: 44.56 Gy
Reference Point Session Dosage Given: 2 Gy
Session Number: 17

## 2022-05-08 ENCOUNTER — Other Ambulatory Visit: Payer: Self-pay

## 2022-05-08 ENCOUNTER — Ambulatory Visit
Admission: RE | Admit: 2022-05-08 | Discharge: 2022-05-08 | Disposition: A | Discharge status: Home / Self Care | Payer: 59 | Source: Ambulatory Visit | Attending: Radiation Oncology | Admitting: Radiation Oncology

## 2022-05-08 DIAGNOSIS — Z170 Estrogen receptor positive status [ER+]: Secondary | ICD-10-CM | POA: Diagnosis not present

## 2022-05-08 DIAGNOSIS — C50412 Malignant neoplasm of upper-outer quadrant of left female breast: Secondary | ICD-10-CM | POA: Diagnosis not present

## 2022-05-08 LAB — RAD ONC ARIA SESSION SUMMARY
Course Elapsed Days: 25
Plan Fractions Treated to Date: 2
Plan Prescribed Dose Per Fraction: 2 Gy
Plan Total Fractions Prescribed: 5
Plan Total Prescribed Dose: 10 Gy
Reference Point Dosage Given to Date: 46.56 Gy
Reference Point Session Dosage Given: 2 Gy
Session Number: 18

## 2022-05-09 ENCOUNTER — Ambulatory Visit
Admission: RE | Admit: 2022-05-09 | Discharge: 2022-05-09 | Disposition: A | Discharge status: Home / Self Care | Payer: 59 | Source: Ambulatory Visit | Attending: Radiation Oncology | Admitting: Radiation Oncology

## 2022-05-09 ENCOUNTER — Other Ambulatory Visit: Payer: Self-pay

## 2022-05-09 ENCOUNTER — Inpatient Hospital Stay: Payer: 59 | Attending: Oncology

## 2022-05-09 DIAGNOSIS — Z170 Estrogen receptor positive status [ER+]: Secondary | ICD-10-CM | POA: Insufficient documentation

## 2022-05-09 DIAGNOSIS — C50512 Malignant neoplasm of lower-outer quadrant of left female breast: Secondary | ICD-10-CM | POA: Insufficient documentation

## 2022-05-09 DIAGNOSIS — C50412 Malignant neoplasm of upper-outer quadrant of left female breast: Secondary | ICD-10-CM

## 2022-05-09 LAB — CBC
HCT: 40 % (ref 36.0–46.0)
Hemoglobin: 13.3 g/dL (ref 12.0–15.0)
MCH: 30 pg (ref 26.0–34.0)
MCHC: 33.3 g/dL (ref 30.0–36.0)
MCV: 90.3 fL (ref 80.0–100.0)
Platelets: 206 10*3/uL (ref 150–400)
RBC: 4.43 MIL/uL (ref 3.87–5.11)
RDW: 11.9 % (ref 11.5–15.5)
WBC: 4.7 10*3/uL (ref 4.0–10.5)
nRBC: 0 % (ref 0.0–0.2)

## 2022-05-09 LAB — RAD ONC ARIA SESSION SUMMARY
Course Elapsed Days: 26
Plan Fractions Treated to Date: 3
Plan Prescribed Dose Per Fraction: 2 Gy
Plan Total Fractions Prescribed: 5
Plan Total Prescribed Dose: 10 Gy
Reference Point Dosage Given to Date: 48.56 Gy
Reference Point Session Dosage Given: 2 Gy
Session Number: 19

## 2022-05-10 ENCOUNTER — Other Ambulatory Visit: Payer: Self-pay | Admitting: *Deleted

## 2022-05-10 ENCOUNTER — Ambulatory Visit
Admission: RE | Admit: 2022-05-10 | Discharge: 2022-05-10 | Disposition: A | Discharge status: Home / Self Care | Payer: 59 | Source: Ambulatory Visit | Attending: Radiation Oncology | Admitting: Radiation Oncology

## 2022-05-10 ENCOUNTER — Other Ambulatory Visit: Payer: Self-pay

## 2022-05-10 DIAGNOSIS — C50412 Malignant neoplasm of upper-outer quadrant of left female breast: Secondary | ICD-10-CM | POA: Insufficient documentation

## 2022-05-10 DIAGNOSIS — Z170 Estrogen receptor positive status [ER+]: Secondary | ICD-10-CM | POA: Insufficient documentation

## 2022-05-10 LAB — RAD ONC ARIA SESSION SUMMARY
Course Elapsed Days: 27
Plan Fractions Treated to Date: 4
Plan Prescribed Dose Per Fraction: 2 Gy
Plan Total Fractions Prescribed: 5
Plan Total Prescribed Dose: 10 Gy
Reference Point Dosage Given to Date: 50.56 Gy
Reference Point Session Dosage Given: 2 Gy
Session Number: 20

## 2022-05-10 MED ORDER — SILVER SULFADIAZINE 1 % EX CREA
1.0000 | TOPICAL_CREAM | Freq: Two times a day (BID) | CUTANEOUS | 2 refills | Status: DC
  Filled 2022-05-10: qty 50, 7d supply, fill #0
  Filled 2022-05-18: qty 50, 7d supply, fill #1
  Filled 2022-06-27: qty 50, 7d supply, fill #2

## 2022-05-11 ENCOUNTER — Other Ambulatory Visit: Payer: Self-pay

## 2022-05-11 ENCOUNTER — Ambulatory Visit
Admission: RE | Admit: 2022-05-11 | Discharge: 2022-05-11 | Disposition: A | Discharge status: Home / Self Care | Payer: 59 | Source: Ambulatory Visit | Attending: Radiation Oncology | Admitting: Radiation Oncology

## 2022-05-11 DIAGNOSIS — C50412 Malignant neoplasm of upper-outer quadrant of left female breast: Secondary | ICD-10-CM | POA: Diagnosis not present

## 2022-05-11 DIAGNOSIS — Z510 Encounter for antineoplastic radiation therapy: Secondary | ICD-10-CM | POA: Diagnosis not present

## 2022-05-11 DIAGNOSIS — Z170 Estrogen receptor positive status [ER+]: Secondary | ICD-10-CM | POA: Diagnosis not present

## 2022-05-11 LAB — RAD ONC ARIA SESSION SUMMARY
Course Elapsed Days: 28
Plan Fractions Treated to Date: 5
Plan Prescribed Dose Per Fraction: 2 Gy
Plan Total Fractions Prescribed: 5
Plan Total Prescribed Dose: 10 Gy
Reference Point Dosage Given to Date: 52.56 Gy
Reference Point Session Dosage Given: 2 Gy
Session Number: 21

## 2022-05-18 ENCOUNTER — Other Ambulatory Visit: Payer: Self-pay

## 2022-06-01 ENCOUNTER — Other Ambulatory Visit: Payer: Self-pay

## 2022-06-01 DIAGNOSIS — Z0000 Encounter for general adult medical examination without abnormal findings: Secondary | ICD-10-CM | POA: Diagnosis not present

## 2022-06-01 DIAGNOSIS — F321 Major depressive disorder, single episode, moderate: Secondary | ICD-10-CM | POA: Diagnosis not present

## 2022-06-01 MED ORDER — ESCITALOPRAM OXALATE 10 MG PO TABS
ORAL_TABLET | ORAL | 2 refills | Status: DC
  Filled 2022-06-01: qty 30, 30d supply, fill #0

## 2022-06-14 ENCOUNTER — Encounter: Payer: Self-pay | Admitting: *Deleted

## 2022-06-15 DIAGNOSIS — H524 Presbyopia: Secondary | ICD-10-CM | POA: Diagnosis not present

## 2022-06-23 ENCOUNTER — Other Ambulatory Visit: Payer: Self-pay | Admitting: *Deleted

## 2022-06-23 DIAGNOSIS — C50412 Malignant neoplasm of upper-outer quadrant of left female breast: Secondary | ICD-10-CM

## 2022-06-26 ENCOUNTER — Encounter: Payer: Self-pay | Admitting: Oncology

## 2022-06-26 ENCOUNTER — Inpatient Hospital Stay: Payer: 59 | Attending: Oncology | Admitting: Oncology

## 2022-06-26 ENCOUNTER — Other Ambulatory Visit: Payer: Self-pay

## 2022-06-26 ENCOUNTER — Ambulatory Visit
Admission: RE | Admit: 2022-06-26 | Discharge: 2022-06-26 | Disposition: A | Discharge status: Home / Self Care | Payer: 59 | Source: Ambulatory Visit | Attending: Radiation Oncology | Admitting: Radiation Oncology

## 2022-06-26 ENCOUNTER — Inpatient Hospital Stay: Payer: 59

## 2022-06-26 VITALS — BP 113/81 | HR 70 | Temp 96.8°F | Resp 18 | Wt 196.8 lb

## 2022-06-26 DIAGNOSIS — C50412 Malignant neoplasm of upper-outer quadrant of left female breast: Secondary | ICD-10-CM

## 2022-06-26 DIAGNOSIS — Z79810 Long term (current) use of selective estrogen receptor modulators (SERMs): Secondary | ICD-10-CM | POA: Insufficient documentation

## 2022-06-26 DIAGNOSIS — M8580 Other specified disorders of bone density and structure, unspecified site: Secondary | ICD-10-CM | POA: Diagnosis not present

## 2022-06-26 DIAGNOSIS — C50911 Malignant neoplasm of unspecified site of right female breast: Secondary | ICD-10-CM | POA: Insufficient documentation

## 2022-06-26 DIAGNOSIS — Z923 Personal history of irradiation: Secondary | ICD-10-CM | POA: Insufficient documentation

## 2022-06-26 DIAGNOSIS — Z170 Estrogen receptor positive status [ER+]: Secondary | ICD-10-CM

## 2022-06-26 LAB — COMPREHENSIVE METABOLIC PANEL
ALT: 12 U/L (ref 0–44)
AST: 15 U/L (ref 15–41)
Albumin: 4 g/dL (ref 3.5–5.0)
Alkaline Phosphatase: 59 U/L (ref 38–126)
Anion gap: 6 (ref 5–15)
BUN: 11 mg/dL (ref 6–20)
CO2: 26 mmol/L (ref 22–32)
Calcium: 9.1 mg/dL (ref 8.9–10.3)
Chloride: 104 mmol/L (ref 98–111)
Creatinine, Ser: 0.88 mg/dL (ref 0.44–1.00)
GFR, Estimated: >60 mL/min (ref >60)
Glucose, Bld: 102 mg/dL — ABNORMAL HIGH (ref 70–99)
Potassium: 4.5 mmol/L (ref 3.5–5.1)
Sodium: 136 mmol/L (ref 135–145)
Total Bilirubin: 0.5 mg/dL (ref 0.3–1.2)
Total Protein: 7 g/dL (ref 6.5–8.1)

## 2022-06-26 MED ORDER — TAMOXIFEN CITRATE 20 MG PO TABS
20.0000 mg | ORAL_TABLET | Freq: Every day | ORAL | 3 refills | Status: DC
  Filled 2022-06-26 – 2022-06-27 (×2): qty 30, 30d supply, fill #0
  Filled 2022-08-07: qty 30, 30d supply, fill #1
  Filled 2022-09-07: qty 30, 30d supply, fill #2
  Filled 2022-10-16: qty 30, 30d supply, fill #3

## 2022-06-26 NOTE — Progress Notes (Signed)
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 Grayland Ormond in the past.  She recently underwent a screening mammogram in March 2023 which showed possible distortion in the left breast.  This was followed by diagnostic mammogram and ultrasound which showed hypoechoic mass 12 x 4 x 13 mm in the left breast at the 1 o'clock position.  There was another mass 6 x 8 x 8 mm at the 2 o'clock position 7 cm from the nipple.  No suspicious left axillary adenopathy.  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  The second breast mass was negative for malignancy and consistent with benign fibroadenoma.   Final pathology showed 9 mm grade 1 tubular carcinoma with negative margins ER/PR positive HER2 negative.4 sentinel lymph nodes negative for malignancy.  Patient did not require Oncotype testing or adjuvant chemotherapy.  She completed adjuvant radiation therapy in August 2023.  Interval history-patient completed radiation therapy in early August 2023.  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  She does report some ongoing hot flashes.  ECOG PS- 0 Pain scale- 0   Review of systems- Review of Systems  Constitutional:  Negative for chills, fever, malaise/fatigue and weight loss.  HENT:   Negative for congestion, ear discharge and nosebleeds.   Eyes:  Negative for blurred vision.  Respiratory:  Negative for cough, hemoptysis, sputum production, shortness of breath and wheezing.   Cardiovascular:  Negative for chest pain, palpitations, orthopnea and claudication.  Gastrointestinal:  Negative for abdominal pain, blood in stool, constipation, diarrhea, heartburn, melena, nausea and vomiting.  Genitourinary:  Negative for dysuria, flank pain, frequency, hematuria and urgency.  Musculoskeletal:  Negative for back pain, joint pain and myalgias.  Skin:  Negative for rash.  Neurological:  Negative for dizziness, tingling, focal weakness, seizures, weakness and headaches.  Endo/Heme/Allergies:  Does not bruise/bleed easily.  Psychiatric/Behavioral:  Negative for depression and suicidal ideas. The patient does not have insomnia.       No Known Allergies   Past Medical History:  Diagnosis Date   Anemia    Cancer ([place])    left beast     Past Surgical History:  Procedure Laterality Date   ABDOMINAL HYSTERECTOMY     AXILLARY SENTINEL NODE BIOPSY Left 03/13/2022   Procedure: AXILLARY SENTINEL NODE BIOPSY;  Surgeon: Robert Bellow, MD;  Location: [facility] ORS;  Service: General;  Laterality: Left;   BREAST BIOPSY Left 02/09/2022   [place] bx 2:00 7cmfn heart marker, path pending   BREAST BIOPSY Left 02/09/2022   [place] bx, 1:00 10cmfn coil marker, path pending   BREAST LUMPECTOMY WITH NEEDLE LOCALIZATION Left 03/13/2022   Procedure: BREAST LUMPECTOMY WITH NEEDLE LOCALIZATION;  Surgeon: Robert Bellow, MD;  Location: [facility] ORS;  Service: General;  Laterality: Left;   CESAREAN SECTION     COLONOSCOPY  W/ POLYPECTOMY  2021   DILATION AND CURETTAGE OF UTERUS     2001   WISDOM TOOTH EXTRACTION      Social History   Socioeconomic History   Marital status: Married    Spouse name: Saralyn Pilar   Number of children: 2   Years of education: Not on file   Highest education level: Not on file   Occupational History   Not on file  Tobacco Use   Smoking status: Never   Smokeless tobacco: Never  Substance and Sexual Activity   Alcohol use: Not Currently    Alcohol/week: 0.0 standard drinks of alcohol   Drug use: Not Currently   Sexual activity: Not on file  Other Topics Concern   Not on file  Social History Narrative   Not on file   Social Determinants of Health   Financial Resource Strain: Not on file  Food Insecurity: Not on file  Transportation Needs: Not on file  Physical Activity: Not on file  Stress: Not on file  Social Connections: Not on file  Intimate Partner Violence: Not on file    Family History  Problem Relation Age of Onset   Multiple myeloma Mother    Breast cancer Maternal Aunt    Breast cancer Paternal Grandmother        in her 43's     Current Outpatient Medications:    ibuprofen (ADVIL) 800 MG tablet, Take 1 tablet (800 mg total) by mouth every 8 (eight) hours as needed., Disp: 90 tablet, Rfl: 1   Multiple Vitamin (MULTIVITAMIN) capsule, Take 1 capsule by mouth daily. , Disp: , Rfl:    Multiple Vitamins-Minerals (EMERGEN-C IMMUNE PO), Take 1 packet by mouth daily., Disp: , Rfl:    COVID-19 At Home Antigen Test (CARESTART COVID-19 HOME TEST) KIT, use as directed within package instruction (Patient not taking: Reported on 06/26/2022), Disp: 2 kit, Rfl: 0   escitalopram (LEXAPRO) 10 MG tablet, Take 1 tablet (10 mg total) by mouth once daily for 90 days (Patient not taking: Reported on 06/26/2022), Disp: 30 tablet, Rfl: 2   HYDROcodone-acetaminophen (NORCO/VICODIN) 5-325 MG tablet, Take 1 tablet by mouth every 4 (four) hours as needed for moderate pain. (Patient not taking: Reported on 06/26/2022), Disp: 12 tablet, Rfl: 0   naproxen sodium (ANAPROX) 550 MG tablet, Take 550 mg by mouth 2 (two) times daily as needed.  (Patient not taking: Reported on 06/26/2022), Disp: , Rfl:    silver sulfADIAZINE (SILVADENE) 1 % cream, Apply 1 Application topically 2  (two) times daily. (Patient not taking: Reported on 06/26/2022), Disp: 50 g, Rfl: 2   tamoxifen (NOLVADEX) 20 MG tablet, Take 1 tablet (20 mg total) by mouth daily., Disp: 30 tablet, Rfl: 3  Current Facility-Administered Medications:    betamethasone acetate-betamethasone sodium phosphate (CELESTONE) injection 3 mg, 3 mg, Intra-articular, Once, Edrick Kins, DPM  Physical exam:  Vitals:   06/26/22 1029  BP: 113/81  Pulse: 70  Resp: 18  Temp: (!) 96.8 F (36 C)  SpO2: 100%  Weight: 196 lb 12.8 oz (89.3 kg)   Physical Exam Cardiovascular:     Rate and Rhythm: Normal rate and regular rhythm.     Heart sounds: Normal heart sounds.  Pulmonary:     Effort: Pulmonary effort is normal.  Skin:    General: Skin is warm and dry.  Neurological:     Mental Status: She is alert and oriented to person, place, and time.   Breast exam: Changes of radiation dermatitis  and hyperpigmentation noted over left breast     Latest Ref Rng & Units 06/26/2022   10:18 AM  CMP  Glucose 70 - 99 mg/dL 102   BUN 6 - 20 mg/dL 11   Creatinine 0.44 - 1.00 mg/dL 0.88   Sodium 135 - 145 mmol/L 136   Potassium 3.5 - 5.1 mmol/L 4.5   Chloride 98 - 111 mmol/L 104   CO2 22 - 32 mmol/L 26   Calcium 8.9 - 10.3 mg/dL 9.1   Total Protein 6.5 - 8.1 g/dL 7.0   Total Bilirubin 0.3 - 1.2 mg/dL 0.5   Alkaline Phos 38 - 126 U/L 59   AST 15 - 41 U/L 15   ALT 0 - 44 U/L 12       Latest Ref Rng & Units 05/09/2022    1:56 PM  CBC  WBC 4.0 - 10.5 K/uL 4.7   Hemoglobin 12.0 - 15.0 g/dL 13.3   Hematocrit 36.0 - 46.0 % 40.0   Platelets 150 - 400 K/uL 206      Assessment and plan- Patient is a 51 y.o. female with history of stage I left breast cancer ER/PR positive HER2 negative status postlumpectomy and adjuvant radiation therapy here forIn follow-up  Patient has healed well from her radiation treatment and I will start her on tamoxifen at this time.  Patient is s/p hysterectomy but still has ovaries in situ.  Her  estradiol levels were low at 27.5 but FSH was mildly elevated at 33.3 and therefore not conclusively postmenopausal.  I would like her to take tamoxifen for 6 months to a year and I will repeat her hormone levels again in 4 months time.  Baseline bone density scan also showed osteopenia.   Visit Diagnosis 1. Malignant neoplasm of upper-outer quadrant of left breast in female, estrogen receptor positive ([place])      Dr. Randa Evens, MD, MPH [facility] [phone] 06/26/2022 1:44 PM

## 2022-06-26 NOTE — Progress Notes (Signed)
Radiation Oncology Follow up Note  Name: Sabrina Oneill   Date:   06/26/2022 MRN:  [id number] DOB: 1970-11-22    This 51 y.o. female presents to the clinic today for 1 month follow-up status post whole breast radiation to her left breast for stage Ia (T1b N0 M0) ER/PR positive invasive mammary carcinoma status post wide local excision.  REFERRING PROVIDER: Derinda Late, MD  HPI: Patient is a 51 year old female now at 1 month having completed whole breast radiation to her right breast for stage Ia invasive mammary carcinoma ER/PR positive.  She is seen today in routine follow-up doing well specifically denies breast tenderness cough or bone pain.  She is recently been recommended.  Endocrine therapy although has not started that yet.  COMPLICATIONS OF TREATMENT: none  FOLLOW UP COMPLIANCE: keeps appointments   PHYSICAL EXAM:  LMP 10/13/2019  Lungs are clear to A&P cardiac examination essentially unremarkable with regular rate and rhythm. No dominant mass or nodularity is noted in either breast in 2 positions examined. Incision is well-healed. No axillary or supraclavicular adenopathy is appreciated. Cosmetic result is excellent.  Well-developed well-nourished patient in NAD. HEENT reveals PERLA, EOMI, discs not visualized.  Oral cavity is clear. No oral mucosal lesions are identified. Neck is clear without evidence of cervical or supraclavicular adenopathy. Lungs are clear to A&P. Cardiac examination is essentially unremarkable with regular rate and rhythm without murmur rub or thrill. Abdomen is benign with no organomegaly or masses noted. Motor sensory and DTR levels are equal and symmetric in the upper and lower extremities. Cranial nerves II through XII are grossly intact. Proprioception is intact. No peripheral adenopathy or edema is identified. No motor or sensory levels are noted. Crude visual fields are within normal range.  RADIOLOGY RESULTS: No current films to review  PLAN:  Present time patient is doing well 1 month out from whole breast radiation and pleased with her overall progress she will start endocrine therapy.  I am asked to see her back in 5 months for follow-up.  Patient knows to call with any concerns at any time.  I would like to take this opportunity to thank you for allowing me to participate in the care of your patient.Noreene Filbert, MD

## 2022-06-27 ENCOUNTER — Other Ambulatory Visit: Payer: Self-pay

## 2022-06-28 ENCOUNTER — Other Ambulatory Visit: Payer: Self-pay

## 2022-06-28 ENCOUNTER — Telehealth: Payer: Self-pay | Admitting: Licensed Clinical Social Worker

## 2022-06-28 NOTE — Telephone Encounter (Signed)
Contacted Ms. Hellmann to see if she would be interested in genetic counseling/testing per message from Dr. Janese Banks. Ms. Leon would like to pursue this so I scheduled her to see me Tuesday September 26th at 11 am.

## 2022-07-04 ENCOUNTER — Inpatient Hospital Stay: Payer: 59

## 2022-07-04 ENCOUNTER — Encounter: Payer: Self-pay | Admitting: Licensed Clinical Social Worker

## 2022-07-04 ENCOUNTER — Inpatient Hospital Stay (HOSPITAL_BASED_OUTPATIENT_CLINIC_OR_DEPARTMENT_OTHER): Payer: 59 | Admitting: Licensed Clinical Social Worker

## 2022-07-04 DIAGNOSIS — C50412 Malignant neoplasm of upper-outer quadrant of left female breast: Secondary | ICD-10-CM | POA: Diagnosis not present

## 2022-07-04 DIAGNOSIS — Z803 Family history of malignant neoplasm of breast: Secondary | ICD-10-CM

## 2022-07-04 DIAGNOSIS — Z170 Estrogen receptor positive status [ER+]: Secondary | ICD-10-CM

## 2022-07-04 DIAGNOSIS — Z8042 Family history of malignant neoplasm of prostate: Secondary | ICD-10-CM

## 2022-07-04 NOTE — Progress Notes (Signed)
REFERRING PROVIDER: Sindy Guadeloupe, MD [place],  [place] [address]  PRIMARY PROVIDER:  Derinda Late, MD  PRIMARY REASON FOR VISIT:  1. Malignant neoplasm of upper-outer quadrant of left breast in female, estrogen receptor positive ([place])   2. Family history of breast cancer   3. Family history of prostate cancer      HISTORY OF PRESENT ILLNESS:   Sabrina Oneill, a 51 y.o. female, was seen for a [place] cancer genetics consultation at the request of Dr. Janese Banks due to a personal and family history of cancer.  [name] presents to clinic today to discuss the possibility of a hereditary predisposition to cancer, genetic testing, and to further clarify her future cancer risks, as well as potential cancer risks for family members.   In 2023, at the age of 25, [name] was diagnosed with invasive mammary carcinoma of the left breast, ER/PR+,HER2-. The treatment plan included lumpectomy and adjuvant radiation .   CANCER HISTORY:  Oncology History  Malignant neoplasm of upper-outer quadrant of left breast in female, estrogen receptor positive ([place])  02/26/2022 Initial Diagnosis   Malignant neoplasm of upper-outer quadrant of left breast in female, estrogen receptor positive ([place])   02/26/2022 Cancer Staging   Staging form: Breast, AJCC 8th Edition - Clinical stage from 02/26/2022: Stage IA (cT1b, cN0, cM0, G1, ER+, PR+, HER2-) - Signed by Sindy Guadeloupe, MD on 02/26/2022 Histologic grading system: 3 grade system   03/24/2022 Cancer Staging   Staging form: Breast, AJCC 8th Edition - Pathologic stage from 03/24/2022: Stage IA (pT1b, pN0, cM0, G1, ER+, PR+, HER2-) - Signed by Sindy Guadeloupe, MD on 03/24/2022 Multigene prognostic tests performed: None Histologic grading system: 3 grade system     RISK FACTORS:  Menarche was at age 82.  First live birth at age 89.  Ovaries intact: yes.  Hysterectomy: yes.  Menopausal status: perimenopausal.  HRT use: 0  years. Colonoscopy: yes;  2 polyps . Mammogram within the last year: yes.  Past Medical History:  Diagnosis Date   Anemia    Cancer ([place])    left beast    Past Surgical History:  Procedure Laterality Date   ABDOMINAL HYSTERECTOMY     AXILLARY SENTINEL NODE BIOPSY Left 03/13/2022   Procedure: AXILLARY SENTINEL NODE BIOPSY;  Surgeon: Robert Bellow, MD;  Location: [facility] ORS;  Service: General;  Laterality: Left;   BREAST BIOPSY Left 02/09/2022   [place] bx 2:00 7cmfn heart marker, path pending   BREAST BIOPSY Left 02/09/2022   [place] bx, 1:00 10cmfn coil marker, path pending   BREAST LUMPECTOMY WITH NEEDLE LOCALIZATION Left 03/13/2022   Procedure: BREAST LUMPECTOMY WITH NEEDLE LOCALIZATION;  Surgeon: Robert Bellow, MD;  Location: [facility] ORS;  Service: General;  Laterality: Left;   CESAREAN SECTION     COLONOSCOPY W/ POLYPECTOMY  2021   DILATION AND CURETTAGE OF UTERUS     2001   WISDOM TOOTH EXTRACTION      FAMILY HISTORY:  We obtained a detailed, 4-generation family history.  Significant diagnoses are listed below: Family History  Problem Relation Age of Onset   Multiple myeloma Mother    Breast cancer Maternal Aunt        d. 45s   Prostate cancer Maternal Uncle        metastatic d. 20s   Breast cancer Paternal Grandmother        dx 35s   [name] has 2 sons, 78 and 40. She has  6 paternal half siblings, no cancers.  [name] mother passed of multiple myeloma at 77. Patient has 1 maternal half aunt that had breast cancer in her 52s and passed in her 73s. A maternal uncle had metastatic prostate cancer in his 75s that he passed from. No other known cancers on this side of the family.  [name] father is living at 84. Paternal grandmother had breast cancer in her 40s and passed in her 6s. No other known cancers on this side of the family.  [name] is unaware of previous family history of genetic testing for hereditary cancer risks. There is no reported  Ashkenazi Jewish ancestry. There is no known consanguinity.    GENETIC COUNSELING ASSESSMENT: [name] is a 51 y.o. female with a personal and family history of cancer which is somewhat suggestive of a hereditary cancer syndrome and predisposition to cancer. We, therefore, discussed and recommended the following at today's visit.   DISCUSSION: We discussed that approximately 10% of breast cancer is hereditary. Most cases of hereditary breast cancer are associated with BRCA1/BRCA2 genes, although there are other genes associated with hereditary cancer as well. Cancers and risks are gene specific. We discussed that testing is beneficial for several reasons including knowing about cancer risks, identifying potential screening and risk-reduction options that may be appropriate, and to understand if other family members could be at risk for cancer and allow them to undergo genetic testing.   We reviewed the characteristics, features and inheritance patterns of hereditary cancer syndromes. We also discussed genetic testing, including the appropriate family members to test, the process of testing, insurance coverage and turn-around-time for results. We discussed the implications of a negative, positive and/or variant of uncertain significant result. We recommended [name] pursue genetic testing for the [facility] CancerNext-Expanded+RNA gene panel.   Based on [name]'s personal and family history of cancer, she meets medical criteria for genetic testing. Despite that she meets criteria, she may still have an out of pocket cost. We discussed that if her out of pocket cost for testing is over $100, the laboratory will call and confirm whether she wants to proceed with testing.  If the out of pocket cost of testing is less than $100 she will be billed by the genetic testing laboratory.   PLAN: After considering the risks, benefits, and limitations, [name] provided informed consent to pursue genetic  testing and the blood sample was sent to [facility] for analysis of the CancerNext-Expanded+RNA panel. Results should be available within approximately 2-3 weeks' time, at which point they will be disclosed by telephone to Ms. Wisor, as will any additional recommendations warranted by these results. Ms. Everman will receive a summary of her genetic counseling visit and a copy of her results once available. This information will also be available in Epic.   Ms. Orne questions were answered to her satisfaction today. Our contact information was provided should additional questions or concerns arise. Thank you for the referral and allowing [place] to share in the care of your patient.   Faith Rogue, MS, [facility] Genetic Counselor [email or web address] Phone: [phone]  The patient was seen for a total of 25 minutes in face-to-face genetic counseling.  Dr. Grayland Ormond was available for discussion regarding this case.   _______________________________________________________________________ For Office Staff:  Number of people involved in session: 1 Was an Intern/ student involved with case: no

## 2022-07-25 ENCOUNTER — Other Ambulatory Visit: Payer: Self-pay

## 2022-07-25 DIAGNOSIS — F321 Major depressive disorder, single episode, moderate: Secondary | ICD-10-CM | POA: Diagnosis not present

## 2022-07-25 MED ORDER — ESCITALOPRAM OXALATE 10 MG PO TABS
10.0000 mg | ORAL_TABLET | Freq: Every day | ORAL | 1 refills | Status: DC
  Filled 2022-07-25: qty 90, 90d supply, fill #0
  Filled 2023-03-05: qty 90, 90d supply, fill #1

## 2022-08-07 ENCOUNTER — Telehealth: Payer: Self-pay | Admitting: Licensed Clinical Social Worker

## 2022-08-07 ENCOUNTER — Other Ambulatory Visit: Payer: Self-pay

## 2022-08-07 NOTE — Telephone Encounter (Signed)
I contacted Ms. Ige to discuss her genetic testing results. 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 We discussed this result briefly. Patient will return to discuss further on Thursday 08/10/2022 at 2 pm. No other pathogenic variants were identified in the 77 genes analyzed. Detailed clinic note to follow.   The test report has been scanned into EPIC and is located under the Molecular Pathology section of the Results Review tab.  A portion of the result report is included below for reference.      Faith Rogue, MS, [facility] Genetic Counselor [email or web address] Phone: [phone]

## 2022-08-10 ENCOUNTER — Inpatient Hospital Stay: Payer: 59 | Attending: Oncology | Admitting: Licensed Clinical Social Worker

## 2022-08-10 DIAGNOSIS — Z1379 Encounter for other screening for genetic and chromosomal anomalies: Secondary | ICD-10-CM | POA: Insufficient documentation

## 2022-08-10 NOTE — Progress Notes (Signed)
HPI:   Ms. Bobak was previously seen in the [facility] due to a personal and family history of breast cancer and concerns regarding a hereditary predisposition to cancer. Please refer to our prior cancer genetics clinic note for more information regarding our discussion, assessment and recommendations, at the time. Ms. Ciolek recent genetic test results were disclosed to her, as were recommendations warranted by these results. These results and recommendations are discussed in more detail below.  CANCER HISTORY:  Oncology History  Malignant neoplasm of upper-outer quadrant of left breast in female, estrogen receptor positive ([place])  02/26/2022 Initial Diagnosis   Malignant neoplasm of upper-outer quadrant of left breast in female, estrogen receptor positive ([place])   02/26/2022 Cancer Staging   Staging form: Breast, AJCC 8th Edition - Clinical stage from 02/26/2022: Stage IA (cT1b, cN0, cM0, G1, ER+, PR+, HER2-) - Signed by Sindy Guadeloupe, MD on 02/26/2022 Histologic grading system: 3 grade system   03/24/2022 Cancer Staging   Staging form: Breast, AJCC 8th Edition - Pathologic stage from 03/24/2022: Stage IA (pT1b, pN0, cM0, G1, ER+, PR+, HER2-) - Signed by Sindy Guadeloupe, MD on 03/24/2022 Multigene prognostic tests performed: None Histologic grading system: 3 grade system    Genetic Testing   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 The remainder of testing was negative/normal. The report date is 08/02/2022.  The CancerNext-Expanded + RNAinsight gene panel offered by [employer] and includes sequencing and rearrangement analysis for the following 77 genes: IP, ALK, APC*, ATM*, AXIN2, BAP1, BARD1, BLM, BMPR1A, BRCA1*, BRCA2*, BRIP1*, CDC73, CDH1*,CDK4, CDKN1B, CDKN2A, CHEK2*, CTNNA1, DICER1, FANCC, FH, FLCN, GALNT12, KIF1B, LZTR1, MAX, MEN1, MET, MLH1*, MSH2*, MSH3, MSH6*, MUTYH*, NBN, NF1*, NF2, NTHL1,  PALB2*, PHOX2B, PMS2*, POT1, PRKAR1A, PTCH1, PTEN*, RAD51C*, RAD51D*,RB1, RECQL, RET, SDHA, SDHAF2, SDHB, SDHC, SDHD, SMAD4, SMARCA4, SMARCB1, SMARCE1, STK11, SUFU, TMEM127, TP53*,TSC1, TSC2, VHL and XRCC2 (sequencing and deletion/duplication); EGFR, EGLN1, HOXB13, KIT, MITF, PDGFRA, POLD1 and POLE (sequencing only); EPCAM and GREM1 (deletion/duplication only).     FAMILY HISTORY:  We obtained a detailed, 4-generation family history.  Significant diagnoses are listed below: Family History  Problem Relation Age of Onset   Multiple myeloma Mother    Breast cancer Maternal Aunt        d. 42s   Prostate cancer Maternal Uncle        metastatic d. 89s   Breast cancer Paternal Grandmother        dx 29s    Ms. Paulson has 2 sons, 45 and 35. She has 6 paternal half siblings, no cancers.   Ms. Fahrney mother passed of multiple myeloma at 78. Patient has 1 maternal half aunt that had breast cancer in her 28s and passed in her 67s. A maternal uncle had metastatic prostate cancer in his 45s that he passed from. No other known cancers on this side of the family.   Ms. Santy father is living at 28. Paternal grandmother had breast cancer in her 58s and passed in her 41s. No other known cancers on this side of the family.   Ms. Fullenwider is unaware of previous family history of genetic testing for hereditary cancer risks. There is no reported Ashkenazi Jewish ancestry. There is no known consanguinity.   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 Details below. The remainder of testing was negative/normal.    The CancerNext-Expanded + RNAinsight gene panel offered  by [employer] and includes sequencing and rearrangement analysis for the following 77 genes: IP, ALK, APC*, ATM*, AXIN2, BAP1, BARD1, BLM, BMPR1A, BRCA1*, BRCA2*, BRIP1*, CDC73, CDH1*,CDK4, CDKN1B, CDKN2A, CHEK2*, CTNNA1, DICER1, FANCC, FH,  FLCN, GALNT12, KIF1B, LZTR1, MAX, MEN1, MET, MLH1*, MSH2*, MSH3, MSH6*, MUTYH*, NBN, NF1*, NF2, NTHL1, PALB2*, PHOX2B, PMS2*, POT1, PRKAR1A, PTCH1, PTEN*, RAD51C*, RAD51D*,RB1, RECQL, RET, SDHA, SDHAF2, SDHB, SDHC, SDHD, SMAD4, SMARCA4, SMARCB1, SMARCE1, STK11, SUFU, TMEM127, TP53*,TSC1, TSC2, VHL and XRCC2 (sequencing and deletion/duplication); EGFR, EGLN1, HOXB13, KIT, MITF, PDGFRA, POLD1 and POLE (sequencing only); EPCAM and GREM1 (deletion/duplication only).  The test report has been scanned into EPIC and is located under the Molecular Pathology section of the Results Review tab.  A portion of the result report is included below for reference. Genetic testing reported out on 08/02/2022.      Even though a pathogenic variant was not identified, possible explanations for the cancer in the family may include: There may be no hereditary risk for cancer in the family. The cancers in Ms. Vidrio and/or her family may be sporadic/familial or due to other genetic and environmental factors. There may be a gene mutation in one of these genes that current testing methods cannot detect but that chance is small. There could be another gene that has not yet been discovered, or that we have not yet tested, that is responsible for the cancer diagnoses in the family.  It is also possible there is a hereditary cause for the cancer in the family that Ms. Domagala did not inherit.  Therefore, it is important to remain in touch with cancer genetics in the future so that we can continue to offer Ms. Kerkman the most up to date genetic testing.   LZTR1:  Individuals with one LZTR1 variant have an increased chance to develop schwannomatosis. Individuals with a variant on one or both copies of their LZTR1 gene also have an increased chance to develop Noonan spectrum disorder (NSD).   Schwannomatosis is associated with an increased chance to develop multiple tumors of the nerves (schwannomas) throughout the body (most  often in the spine and peripheral nerves). These tumors are noncancerous (benign) but can cause pain and other symptoms due to their location and/or size. There are criteria to establish diagnosis of LZTR1-related schwannomatosis; a positive variant alone is not enough for a diagnosis.    Noonan spectrum disorders are a group of childhood developmental disorders that have common symptoms. These include cardiovascular conditions (such as a congenital heart defect or hypertrophic cardiomyopathy), short stature, distinctive facial features, areas of different pigmentation of the skin (such as dark brown spots called lentigines and cafe au lait spots), and a variable degree of developmental delay. Individuals with NSD also have an increased risk of developing benign or cancerous tumors and hematologic abnormalities.    An individual with cardiac abnormalities an differences in development, growth and appearance is suggestive of Noonan disorder. Ms. Sant  does not report any of these features for herself.   An individual with personal or family history of schwannomas, or pain/other symptoms of schwannoma, is suggestive of schwannomatosis. Ms. Salada does not report any history/family history of schwannomas. Current guidelines do not recommend surveillance in LZTR1 positive individuals with no personal or family history suggestive of schwannomatosis. The risk of tumor development in these individuals is though to be low (1% or less).    No changes in management are recommended for Ms. Pais at this point in time based on her LZTR1 mutation.  ADDITIONAL GENETIC TESTING:  We discussed with Ms. Belleville that her genetic testing was fairly extensive.  If there are additional relevant genes identified to increase cancer risk that can be analyzed in the future, we would be happy to discuss and coordinate this testing at that time.    CANCER SCREENING RECOMMENDATIONS:  Ms. Simao test result is  considered essentially normal. This means that we have not identified a hereditary cause for her personal and family history of cancer at this time.   An individual's cancer risk and medical management are not determined by genetic test results alone. Overall cancer risk assessment incorporates additional factors, including personal medical history, family history, and any available genetic information that may result in a personalized plan for cancer prevention and surveillance. Therefore, it is recommended she continue to follow the cancer management and screening guidelines provided by her oncology and primary healthcare provider.  RECOMMENDATIONS FOR FAMILY MEMBERS:   Since she did not inherit a identifiable mutation in a cancer predisposition gene included on this panel, her children could not have inherited a known mutation from her in one of these genes. Individuals in this family might be at some increased risk of developing cancer, over the general population risk, due to the family history of cancer.  Individuals in the family should notify their providers of the family history of cancer. We recommend women in this family have a yearly mammogram beginning at age 92, or 26 years younger than the earliest onset of cancer, an annual clinical breast exam, and perform monthly breast self-exams.  Family members should have colonoscopies by at age 51, or earlier, as recommended by their providers. Since we now know the mutation in Ms. Bernabei, we can test relatives to determine their LZTR1 status. We will be happy to meet with any family members or refer them to a genetic counselor in their local area. To locate genetic counselors in other cities, individuals can visit the website of the [employer] ([employer]ist[employer].se) and Secretary/administrator for a Social worker by zip code.    FOLLOW-UP:  Lastly, we discussed with Ms. Mikami that cancer genetics is a rapidly advancing field and it is possible  that new genetic tests will be appropriate for her and/or her family members in the future. We encouraged her to remain in contact with cancer genetics on an annual basis so we can update her personal and family histories and let her know of advances in cancer genetics that may benefit this family.   Our contact number was provided. Ms. Daponte questions were answered to her satisfaction, and she knows she is welcome to call us at anytime with additional questions or concerns.    Faith Rogue, MS, [facility] Genetic Counselor [email or web address] Phone: [phone]

## 2022-09-07 ENCOUNTER — Other Ambulatory Visit: Payer: Self-pay

## 2022-09-12 DIAGNOSIS — Z170 Estrogen receptor positive status [ER+]: Secondary | ICD-10-CM | POA: Diagnosis not present

## 2022-09-12 DIAGNOSIS — C50412 Malignant neoplasm of upper-outer quadrant of left female breast: Secondary | ICD-10-CM | POA: Diagnosis not present

## 2022-09-21 ENCOUNTER — Encounter: Payer: Self-pay | Admitting: Oncology

## 2022-09-23 ENCOUNTER — Encounter: Payer: Self-pay | Admitting: Oncology

## 2022-10-24 ENCOUNTER — Other Ambulatory Visit: Payer: Self-pay

## 2022-10-24 DIAGNOSIS — D500 Iron deficiency anemia secondary to blood loss (chronic): Secondary | ICD-10-CM

## 2022-10-25 ENCOUNTER — Inpatient Hospital Stay: Payer: Commercial Managed Care - PPO | Attending: Oncology | Admitting: Medical Oncology

## 2022-10-25 ENCOUNTER — Encounter: Payer: Self-pay | Admitting: Medical Oncology

## 2022-10-25 ENCOUNTER — Inpatient Hospital Stay: Payer: Commercial Managed Care - PPO

## 2022-10-25 VITALS — BP 116/70 | HR 84 | Temp 98.3°F | Wt 198.0 lb

## 2022-10-25 DIAGNOSIS — C50412 Malignant neoplasm of upper-outer quadrant of left female breast: Secondary | ICD-10-CM | POA: Diagnosis not present

## 2022-10-25 DIAGNOSIS — Z79810 Long term (current) use of selective estrogen receptor modulators (SERMs): Secondary | ICD-10-CM | POA: Diagnosis not present

## 2022-10-25 DIAGNOSIS — C50919 Malignant neoplasm of unspecified site of unspecified female breast: Secondary | ICD-10-CM

## 2022-10-25 DIAGNOSIS — Z170 Estrogen receptor positive status [ER+]: Secondary | ICD-10-CM

## 2022-10-25 DIAGNOSIS — D500 Iron deficiency anemia secondary to blood loss (chronic): Secondary | ICD-10-CM

## 2022-10-25 NOTE — Progress Notes (Signed)
Survivorship Care Plan visit completed.  Treatment summary reviewed and given to patient.  ASCO answers booklet reviewed and given to patient.  CARE program and Cancer Transitions discussed with patient along with other resources cancer center offers to patients and caregivers.  Patient verbalized understanding. 

## 2022-10-25 NOTE — Progress Notes (Signed)
Hematology/Oncology Consult note [facility]  Telephone:([phone] Fax:([phone]) [phone]  Patient Care Team: Derinda Late, MD as PCP - General (Family Medicine) Daiva Huge, RN as Oncology Nurse Navigator Noreene Filbert, MD as Consulting Physician (Radiation Oncology) Sindy Guadeloupe, MD as Consulting Physician (Oncology) Robert Bellow, MD as Consulting Physician (General Surgery)   Name of the patient: Sabrina Oneill  [id number]  1971/08/14   Date of visit: 10/25/22  Diagnosis- stage I ER/PR positive HER2 negative left breast cancer  Chief complaint/ Reason for visit-routine follow-up of breast cancer  Heme/Onc history:  Patient is a 52 year old female with a prior history of iron deficiency anemia for which she has seen Dr. Grayland Ormond in the past.  She recently underwent a screening mammogram in March 2023 which showed possible distortion in the left breast.  This was followed by diagnostic mammogram and ultrasound which showed hypoechoic mass 12 x 4 x 13 mm in the left breast at the 1 o'clock position.  There was another mass 6 x 8 x 8 mm at the 2 o'clock position 7 cm from the nipple.  No suspicious left axillary adenopathy.  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  The second breast mass was negative for malignancy and consistent with benign fibroadenoma.   Final pathology showed 9 mm grade 1 tubular carcinoma with negative margins ER/PR positive HER2 negative.4 sentinel lymph nodes negative for malignancy.  Patient did not require Oncotype testing or adjuvant chemotherapy.  She completed adjuvant radiation therapy in August 2023.  Interval history- Patient reports that she is doing really well. Initially after completing her radiation treatment she had some radiation dermatitis. This pain has  resolved. She is doing well on her Tamoxifen. She gets mild occasional hot flashes which are not bothersome. She was concerned about MI/stroke risks involved with the Tamoxifen so she discussed with Dr. Terri Piedra and he recommend that she start taking a 81 mg asa along with it. She is tolerating this well without bleeding/bruising. No noted vaginal dryness. No changes in family history.  ECOG PS- 0 Pain scale- 0   Review of systems- Review of Systems  Constitutional:  Negative for chills, fever, malaise/fatigue and weight loss.  HENT:  Negative for congestion, ear discharge and nosebleeds.   Eyes:  Negative for blurred vision.  Respiratory:  Negative for cough, hemoptysis, sputum production, shortness of breath and wheezing.   Cardiovascular:  Negative for chest pain, palpitations, orthopnea and claudication.  Gastrointestinal:  Negative for abdominal pain, blood in stool, constipation, diarrhea, heartburn, melena, nausea and vomiting.  Genitourinary:  Negative for dysuria, flank pain, frequency, hematuria and urgency.  Musculoskeletal:  Negative for back pain, joint pain and myalgias.  Skin:  Negative for rash.  Neurological:  Negative for dizziness, tingling, focal weakness, seizures, weakness and headaches.  Endo/Heme/Allergies:  Does not bruise/bleed easily.  Psychiatric/Behavioral:  Negative for depression and suicidal ideas. The patient does not have insomnia.       No Known Allergies   Past Medical History:  Diagnosis Date   Anemia    Cancer ([place])    left beast     Past Surgical History:  Procedure Laterality Date   ABDOMINAL HYSTERECTOMY     AXILLARY SENTINEL NODE BIOPSY Left 03/13/2022   Procedure: AXILLARY SENTINEL NODE BIOPSY;  Surgeon: Robert Bellow,  MD;  Location: [facility] ORS;  Service: General;  Laterality: Left;   BREAST BIOPSY Left 02/09/2022   [place] bx 2:00 7cmfn heart marker, path pending   BREAST BIOPSY Left 02/09/2022   [place] bx, 1:00 10cmfn coil marker, path  pending   BREAST LUMPECTOMY WITH NEEDLE LOCALIZATION Left 03/13/2022   Procedure: BREAST LUMPECTOMY WITH NEEDLE LOCALIZATION;  Surgeon: Robert Bellow, MD;  Location: [facility] ORS;  Service: General;  Laterality: Left;   CESAREAN SECTION     COLONOSCOPY W/ POLYPECTOMY  2021   DILATION AND CURETTAGE OF UTERUS     2001   WISDOM TOOTH EXTRACTION      Social History   Socioeconomic History   Marital status: Married    Spouse name: Saralyn Pilar   Number of children: 2   Years of education: Not on file   Highest education level: Not on file  Occupational History   Not on file  Tobacco Use   Smoking status: Never   Smokeless tobacco: Never  Substance and Sexual Activity   Alcohol use: Not Currently    Alcohol/week: 0.0 standard drinks of alcohol   Drug use: Not Currently   Sexual activity: Not on file  Other Topics Concern   Not on file  Social History Narrative   Not on file   Social Determinants of Health   Financial Resource Strain: Not on file  Food Insecurity: Not on file  Transportation Needs: Not on file  Physical Activity: Not on file  Stress: Not on file  Social Connections: Not on file  Intimate Partner Violence: Not on file    Family History  Problem Relation Age of Onset   Multiple myeloma Mother    Breast cancer Maternal Aunt        d. 38s   Prostate cancer Maternal Uncle        metastatic d. 104s   Breast cancer Paternal Grandmother        dx 25s     Current Outpatient Medications:    escitalopram (LEXAPRO) 10 MG tablet, Take 1 tablet (10 mg total) by mouth daily., Disp: 90 tablet, Rfl: 1   ibuprofen (ADVIL) 800 MG tablet, Take 1 tablet (800 mg total) by mouth every 8 (eight) hours as needed., Disp: 90 tablet, Rfl: 1   Multiple Vitamin (MULTIVITAMIN) capsule, Take 1 capsule by mouth daily. , Disp: , Rfl:    Multiple Vitamins-Minerals (EMERGEN-C IMMUNE PO), Take 1 packet by mouth daily., Disp: , Rfl:    tamoxifen (NOLVADEX) 20 MG tablet, Take 1 tablet (20 mg  total) by mouth daily., Disp: 30 tablet, Rfl: 3   COVID-19 At Home Antigen Test (CARESTART COVID-19 HOME TEST) KIT, use as directed within package instruction (Patient not taking: Reported on 06/26/2022), Disp: 2 kit, Rfl: 0   escitalopram (LEXAPRO) 10 MG tablet, Take 1 tablet (10 mg total) by mouth once daily for 90 days (Patient not taking: Reported on 06/26/2022), Disp: 30 tablet, Rfl: 2   HYDROcodone-acetaminophen (NORCO/VICODIN) 5-325 MG tablet, Take 1 tablet by mouth every 4 (four) hours as needed for moderate pain. (Patient not taking: Reported on 06/26/2022), Disp: 12 tablet, Rfl: 0   naproxen sodium (ANAPROX) 550 MG tablet, Take 550 mg by mouth 2 (two) times daily as needed.  (Patient not taking: Reported on 06/26/2022), Disp: , Rfl:    silver sulfADIAZINE (SILVADENE) 1 % cream, Apply 1 Application topically 2 (two) times daily. (Patient not taking: Reported on 06/26/2022), Disp: 50 g, Rfl: 2  Current Facility-Administered Medications:  betamethasone acetate-betamethasone sodium phosphate (CELESTONE) injection 3 mg, 3 mg, Intra-articular, Once, Edrick Kins, DPM  Physical exam:  Vitals:   10/25/22 1039  BP: 116/70  Pulse: 84  Temp: 98.3 F (36.8 C)  TempSrc: Tympanic  Weight: 198 lb (89.8 kg)   Physical Exam Cardiovascular:     Rate and Rhythm: Normal rate and regular rhythm.     Heart sounds: Normal heart sounds.  Pulmonary:     Effort: Pulmonary effort is normal.  Skin:    General: Skin is warm and dry.  Neurological:     Mental Status: She is alert and oriented to person, place, and time.   Breast exam: Changes of radiation dermatitis and hyperpigmentation noted over left breast. Nontender in nature. No palpable masses or lymphadenopathy bilaterally.      Latest Ref Rng & Units 06/26/2022   10:18 AM  CMP  Glucose 70 - 99 mg/dL 102   BUN 6 - 20 mg/dL 11   Creatinine 0.44 - 1.00 mg/dL 0.88   Sodium 135 - 145 mmol/L 136   Potassium 3.5 - 5.1 mmol/L 4.5   Chloride 98 -  111 mmol/L 104   CO2 22 - 32 mmol/L 26   Calcium 8.9 - 10.3 mg/dL 9.1   Total Protein 6.5 - 8.1 g/dL 7.0   Total Bilirubin 0.3 - 1.2 mg/dL 0.5   Alkaline Phos 38 - 126 U/L 59   AST 15 - 41 U/L 15   ALT 0 - 44 U/L 12       Latest Ref Rng & Units 05/09/2022    1:56 PM  CBC  WBC 4.0 - 10.5 K/uL 4.7   Hemoglobin 12.0 - 15.0 g/dL 13.3   Hematocrit 36.0 - 46.0 % 40.0   Platelets 150 - 400 K/uL 206      Assessment and plan- Patient is a 52 y.o. female with history of stage I left breast cancer ER/PR positive HER2 negative status postlumpectomy and adjuvant radiation therapy here forIn follow-up  Started on tamoxifen  4 months ago. Tolerating well. Hormonal labs pending. Patient is s/p hysterectomy but still has ovaries in situ.  Per Dr. Janese Banks, I would like her to take tamoxifen for 6 months to a year. Does have baseline bone density scan showing osteopenia.    Last mammogram was pre-diagnosis. She will be due for her next in 4 months around April 2024. I have placed the order for this today.   Since she is doing so well I would recommend a follow up in 6 months or sooner if needed  Disposition: Mammogram April 2024 RTC MD In 6 months w/o labs or sooner as needed.   Visit Diagnosis 1. Malignant neoplasm of upper-outer quadrant of left breast in female, estrogen receptor positive ([place])   2. Invasive carcinoma of breast ([place])      [facility] [phone] 10/25/2022 4:51 PM

## 2022-10-26 ENCOUNTER — Other Ambulatory Visit: Payer: Self-pay | Admitting: Medical Oncology

## 2022-10-26 DIAGNOSIS — C50412 Malignant neoplasm of upper-outer quadrant of left female breast: Secondary | ICD-10-CM

## 2022-10-26 DIAGNOSIS — C50919 Malignant neoplasm of unspecified site of unspecified female breast: Secondary | ICD-10-CM

## 2022-10-26 LAB — FSH/LH
FSH: 21.6 m[IU]/mL
LH: 22.4 m[IU]/mL

## 2022-10-26 LAB — ESTRADIOL: Estradiol: 75.9 pg/mL

## 2022-11-08 ENCOUNTER — Inpatient Hospital Stay: Payer: Commercial Managed Care - PPO | Admitting: Occupational Therapy

## 2022-11-17 ENCOUNTER — Other Ambulatory Visit: Payer: Self-pay

## 2022-11-17 DIAGNOSIS — Z01411 Encounter for gynecological examination (general) (routine) with abnormal findings: Secondary | ICD-10-CM | POA: Diagnosis not present

## 2022-11-17 DIAGNOSIS — B9689 Other specified bacterial agents as the cause of diseases classified elsewhere: Secondary | ICD-10-CM | POA: Diagnosis not present

## 2022-11-17 DIAGNOSIS — Z1331 Encounter for screening for depression: Secondary | ICD-10-CM | POA: Diagnosis not present

## 2022-11-17 DIAGNOSIS — N898 Other specified noninflammatory disorders of vagina: Secondary | ICD-10-CM | POA: Diagnosis not present

## 2022-11-17 MED ORDER — METRONIDAZOLE 500 MG PO TABS
500.0000 mg | ORAL_TABLET | Freq: Two times a day (BID) | ORAL | 0 refills | Status: DC
  Filled 2022-11-17: qty 14, 7d supply, fill #0

## 2022-11-22 ENCOUNTER — Other Ambulatory Visit: Payer: Self-pay | Admitting: Oncology

## 2022-11-23 ENCOUNTER — Other Ambulatory Visit: Payer: Self-pay

## 2022-11-23 MED ORDER — TAMOXIFEN CITRATE 20 MG PO TABS
20.0000 mg | ORAL_TABLET | Freq: Every day | ORAL | 3 refills | Status: DC
  Filled 2022-11-23: qty 30, 30d supply, fill #0
  Filled 2022-12-20: qty 30, 30d supply, fill #1
  Filled 2023-01-21: qty 30, 30d supply, fill #2
  Filled 2023-03-05: qty 30, 30d supply, fill #3

## 2022-11-27 ENCOUNTER — Encounter: Payer: Self-pay | Admitting: Radiation Oncology

## 2022-11-27 ENCOUNTER — Ambulatory Visit
Admission: RE | Admit: 2022-11-27 | Discharge: 2022-11-27 | Disposition: A | Discharge status: Home / Self Care | Payer: Commercial Managed Care - PPO | Source: Ambulatory Visit | Attending: Radiation Oncology | Admitting: Radiation Oncology

## 2022-11-27 VITALS — BP 128/84 | HR 79 | Temp 97.6°F | Resp 16 | Ht 62.0 in | Wt 195.0 lb

## 2022-11-27 DIAGNOSIS — Z79810 Long term (current) use of selective estrogen receptor modulators (SERMs): Secondary | ICD-10-CM | POA: Diagnosis not present

## 2022-11-27 DIAGNOSIS — C50412 Malignant neoplasm of upper-outer quadrant of left female breast: Secondary | ICD-10-CM | POA: Diagnosis not present

## 2022-11-27 DIAGNOSIS — Z170 Estrogen receptor positive status [ER+]: Secondary | ICD-10-CM | POA: Diagnosis not present

## 2022-11-27 DIAGNOSIS — Z923 Personal history of irradiation: Secondary | ICD-10-CM | POA: Diagnosis not present

## 2022-11-27 NOTE — Progress Notes (Signed)
Radiation Oncology Follow up Note  Name: Sabrina Oneill   Date:   11/27/2022 MRN:  [id number] DOB: Nov 11, 1970    This 52 y.o. female presents to the clinic today for 28-monthfollow-up status post whole breast radiation to her left breast for stage Ia ER/PR positive invasive mammary carcinoma  REFERRING PROVIDER: BDerinda Late MD  HPI: Patient is a 52year old female now out 6 months having completed whole breast radiation to her left breast for stage Ia (T1b N0 M0) ER/PR positive invasive mammary carcinoma status post wide local excision.  Seen today in routine follow-up she is doing well.  She specifically denies breast tenderness cough or bone pain..  She is scheduled for mammograms in April.  She is currently on tamoxifen tolerating it well without side effect.  COMPLICATIONS OF TREATMENT: none  FOLLOW UP COMPLIANCE: keeps appointments   PHYSICAL EXAM:  BP 128/84 (BP Location: Right Arm, Patient Position: Sitting, Cuff Size: Normal)   Pulse 79   Temp 97.6 F (36.4 C) (Tympanic)   Resp 16   Ht 5' 2"$  (1.575 m)   Wt 195 lb (88.5 kg)   LMP 10/13/2019   BMI 35.67 kg/m  Lungs are clear to A&P cardiac examination essentially unremarkable with regular rate and rhythm. No dominant mass or nodularity is noted in either breast in 2 positions examined. Incision is well-healed. No axillary or supraclavicular adenopathy is appreciated. Cosmetic result is excellent.  Well-developed well-nourished patient in NAD. HEENT reveals PERLA, EOMI, discs not visualized.  Oral cavity is clear. No oral mucosal lesions are identified. Neck is clear without evidence of cervical or supraclavicular adenopathy. Lungs are clear to A&P. Cardiac examination is essentially unremarkable with regular rate and rhythm without murmur rub or thrill. Abdomen is benign with no organomegaly or masses noted. Motor sensory and DTR levels are equal and symmetric in the upper and lower extremities. Cranial nerves II through XII  are grossly intact. Proprioception is intact. No peripheral adenopathy or edema is identified. No motor or sensory levels are noted. Crude visual fields are within normal range.  RADIOLOGY RESULTS: No current films for review  PLAN: Present time patient is doing well 6 months out from whole breast radiation with extremely low side effect profile.  I am pleased with her overall progress have asked to see her back in 6 months for follow-up.  Anticipated mammogram to be performed in April which I will review.  She continues on tamoxifen without side effect.  Patient is to call with any concerns.  I would like to take this opportunity to thank you for allowing me to participate in the care of your patient..Noreene Filbert MD

## 2022-12-06 ENCOUNTER — Inpatient Hospital Stay: Payer: Commercial Managed Care - PPO | Attending: Oncology | Admitting: Occupational Therapy

## 2022-12-06 DIAGNOSIS — C50919 Malignant neoplasm of unspecified site of unspecified female breast: Secondary | ICD-10-CM

## 2022-12-06 NOTE — Therapy (Signed)
[place] at [facility] [address], [place], [place], [address] Phone: [phone]   Fax:  [phone]  Occupational Therapy Screen   Patient Details  Name: Sabrina Oneill MRN: [id number] Date of Birth: [birth year] No data recorded  Encounter Date: 12/06/2022   OT End of Session - 12/06/22 1310     Visit Number 0             Past Medical History:  Diagnosis Date   Anemia    Cancer ([place])    left beast    Past Surgical History:  Procedure Laterality Date   ABDOMINAL HYSTERECTOMY     AXILLARY SENTINEL NODE BIOPSY Left 03/13/2022   Procedure: AXILLARY SENTINEL NODE BIOPSY;  Surgeon: Robert Bellow, MD;  Location: [facility] ORS;  Service: General;  Laterality: Left;   BREAST BIOPSY Left 02/09/2022   [place] bx 2:00 7cmfn heart marker, path pending   BREAST BIOPSY Left 02/09/2022   [place] bx, 1:00 10cmfn coil marker, path pending   BREAST LUMPECTOMY WITH NEEDLE LOCALIZATION Left 03/13/2022   Procedure: BREAST LUMPECTOMY WITH NEEDLE LOCALIZATION;  Surgeon: Robert Bellow, MD;  Location: [facility] ORS;  Service: General;  Laterality: Left;   CESAREAN SECTION     COLONOSCOPY W/ POLYPECTOMY  2021   DILATION AND CURETTAGE OF UTERUS     2001   WISDOM TOOTH EXTRACTION      There were no vitals filed for this visit.   Subjective Assessment - 12/06/22 1309     Subjective  Doing okay - motion good and my breast on the L was always little larger- breast still little hard on the one side -but not painfull    Currently in Pain? Yes    Pain Score 5    Tenderness lateralL breast   Pain Location Breast    Pain Orientation Left    Pain Descriptors / Indicators Tender    Pain Type Surgical pain                 LYMPHEDEMA/ONCOLOGY QUESTIONNAIRE - 12/06/22 0001       Right Upper Extremity Lymphedema   15 cm Proximal to Olecranon Process 37.1 cm    10 cm Proximal to Olecranon Process 35.6 cm    Olecranon Process 26.7 cm      Left  Upper Extremity Lymphedema   15 cm Proximal to Olecranon Process 36.4 cm    10 cm Proximal to Olecranon Process 33.3 cm    Olecranon Process 27 cm                [name] 10/25/22 Assessment and plan- Patient is a 52 y.o. 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 Tolerating well. Hormonal labs pending. Patient is s/p hysterectomy but still has ovaries in situ.  Per Dr. Janese Banks, I would like her to take tamoxifen for 6 months to a year. Does have baseline bone density scan showing osteopenia.     Last mammogram was pre-diagnosis. She will be due for her next in 4 months around April 2024. I have placed the order for this today.    Since she is doing so well I would recommend a follow up in 6 months or sooner if needed   Disposition: Mammogram April 2024 RTC MD In 6 months w/o labs or sooner as needed.  Visit Diagnosis 1. Malignant neoplasm of upper-outer quadrant of left breast in female, estrogen receptor positive ([place])   2. Invasive carcinoma of breast ([place])        OT Screen 12/06/22:   Pt arrive 8 months out from L lumpectomy and radiation . AROM in bilateral shoulder WNL  Tenderness on lateral L breast  5/10 with some fibrosis on 1 - 4 o'clock  Pt ed on soft tissue mob to do 2 x day for 3-5 min  Used mini massager on lateral breast in clinic - and pt did very well - responded great on decrease fibrosis and tendernes Pt to cont with soft tissue massage for 2 wks  And add some Lat pull downs and scapula squeezes using GTB - 12 reps  2 sets pain free  Pt if need new bands stop by Rehab dept downstairs- she is a employee Ed on precautions - to replace bands  Contact me if need follow up  in  about 2 wks                         Visit Diagnosis: Invasive carcinoma of breast [facility])    Problem  List Patient Active Problem List   Diagnosis Date Noted   Genetic testing 08/10/2022   Malignant neoplasm of upper-outer quadrant of left breast in female, estrogen receptor positive ([place]) 02/26/2022   Goals of care, counseling/discussion 02/26/2022   Anemia 02/24/2022   Hypercholesterolemia 05/30/2021   Iron deficiency anemia due to chronic blood loss 07/12/2017   Scalp cyst 02/06/2017   Intramural leiomyoma of uterus 11/06/2014   Dysmenorrhea 04/06/2014    Rosalyn Gess, OTR/L,CLT 12/06/2022, 1:13 PM  [place] at [facility] [address], [place], [place], [address] Phone: [phone]   Fax:  [phone]  Name: Sabrina Oneill MRN: [id number] Date of Birth: 11/09/1970

## 2023-01-08 ENCOUNTER — Ambulatory Visit
Admission: RE | Admit: 2023-01-08 | Discharge: 2023-01-08 | Disposition: A | Discharge status: Home / Self Care | Payer: Commercial Managed Care - PPO | Source: Ambulatory Visit | Attending: Medical Oncology | Admitting: Medical Oncology

## 2023-01-08 DIAGNOSIS — C50919 Malignant neoplasm of unspecified site of unspecified female breast: Secondary | ICD-10-CM | POA: Insufficient documentation

## 2023-01-08 DIAGNOSIS — Z170 Estrogen receptor positive status [ER+]: Secondary | ICD-10-CM

## 2023-01-08 DIAGNOSIS — C50412 Malignant neoplasm of upper-outer quadrant of left female breast: Secondary | ICD-10-CM | POA: Diagnosis not present

## 2023-01-08 DIAGNOSIS — R922 Inconclusive mammogram: Secondary | ICD-10-CM | POA: Diagnosis not present

## 2023-01-21 ENCOUNTER — Encounter: Payer: Self-pay | Admitting: Oncology

## 2023-01-21 ENCOUNTER — Other Ambulatory Visit: Payer: Self-pay

## 2023-01-22 ENCOUNTER — Other Ambulatory Visit: Payer: Self-pay

## 2023-01-30 DIAGNOSIS — C50412 Malignant neoplasm of upper-outer quadrant of left female breast: Secondary | ICD-10-CM | POA: Diagnosis not present

## 2023-01-30 DIAGNOSIS — Z170 Estrogen receptor positive status [ER+]: Secondary | ICD-10-CM | POA: Diagnosis not present

## 2023-03-05 ENCOUNTER — Other Ambulatory Visit: Payer: Self-pay

## 2023-03-15 ENCOUNTER — Other Ambulatory Visit: Payer: Self-pay

## 2023-03-15 DIAGNOSIS — Z79899 Other long term (current) drug therapy: Secondary | ICD-10-CM | POA: Diagnosis not present

## 2023-03-15 DIAGNOSIS — F321 Major depressive disorder, single episode, moderate: Secondary | ICD-10-CM | POA: Diagnosis not present

## 2023-03-15 DIAGNOSIS — C50412 Malignant neoplasm of upper-outer quadrant of left female breast: Secondary | ICD-10-CM | POA: Diagnosis not present

## 2023-03-15 DIAGNOSIS — E7800 Pure hypercholesterolemia, unspecified: Secondary | ICD-10-CM | POA: Diagnosis not present

## 2023-03-15 DIAGNOSIS — Z170 Estrogen receptor positive status [ER+]: Secondary | ICD-10-CM | POA: Diagnosis not present

## 2023-03-15 DIAGNOSIS — F419 Anxiety disorder, unspecified: Secondary | ICD-10-CM | POA: Diagnosis not present

## 2023-03-15 MED ORDER — ESCITALOPRAM OXALATE 10 MG PO TABS
10.0000 mg | ORAL_TABLET | Freq: Every day | ORAL | 1 refills | Status: DC
  Filled 2023-03-15: qty 90, 90d supply, fill #0

## 2023-04-25 ENCOUNTER — Encounter: Payer: Self-pay | Admitting: Oncology

## 2023-04-25 ENCOUNTER — Inpatient Hospital Stay (HOSPITAL_BASED_OUTPATIENT_CLINIC_OR_DEPARTMENT_OTHER): Payer: Commercial Managed Care - PPO | Admitting: Oncology

## 2023-04-25 ENCOUNTER — Inpatient Hospital Stay: Payer: Commercial Managed Care - PPO | Attending: Oncology

## 2023-04-25 VITALS — BP 119/72 | HR 83 | Temp 96.6°F | Resp 18 | Ht 62.0 in | Wt 199.9 lb

## 2023-04-25 DIAGNOSIS — Z08 Encounter for follow-up examination after completed treatment for malignant neoplasm: Secondary | ICD-10-CM | POA: Diagnosis not present

## 2023-04-25 DIAGNOSIS — Z170 Estrogen receptor positive status [ER+]: Secondary | ICD-10-CM | POA: Diagnosis not present

## 2023-04-25 DIAGNOSIS — C50412 Malignant neoplasm of upper-outer quadrant of left female breast: Secondary | ICD-10-CM | POA: Diagnosis not present

## 2023-04-25 DIAGNOSIS — Z853 Personal history of malignant neoplasm of breast: Secondary | ICD-10-CM | POA: Diagnosis not present

## 2023-04-25 DIAGNOSIS — Z79810 Long term (current) use of selective estrogen receptor modulators (SERMs): Secondary | ICD-10-CM | POA: Diagnosis not present

## 2023-04-25 DIAGNOSIS — Z803 Family history of malignant neoplasm of breast: Secondary | ICD-10-CM | POA: Insufficient documentation

## 2023-04-25 DIAGNOSIS — Z5181 Encounter for therapeutic drug level monitoring: Secondary | ICD-10-CM

## 2023-04-25 LAB — CBC WITH DIFFERENTIAL/PLATELET
Abs Immature Granulocytes: 0.01 10*3/uL (ref 0.00–0.07)
Basophils Absolute: 0 10*3/uL (ref 0.0–0.1)
Basophils Relative: 1 %
Eosinophils Absolute: 0 10*3/uL (ref 0.0–0.5)
Eosinophils Relative: 1 %
HCT: 39.3 % (ref 36.0–46.0)
Hemoglobin: 13 g/dL (ref 12.0–15.0)
Immature Granulocytes: 0 %
Lymphocytes Relative: 27 %
Lymphs Abs: 1 10*3/uL (ref 0.7–4.0)
MCH: 30 pg (ref 26.0–34.0)
MCHC: 33.1 g/dL (ref 30.0–36.0)
MCV: 90.6 fL (ref 80.0–100.0)
Monocytes Absolute: 0.3 10*3/uL (ref 0.1–1.0)
Monocytes Relative: 8 %
Neutro Abs: 2.3 10*3/uL (ref 1.7–7.7)
Neutrophils Relative %: 63 %
Platelets: 196 10*3/uL (ref 150–400)
RBC: 4.34 MIL/uL (ref 3.87–5.11)
RDW: 12.3 % (ref 11.5–15.5)
WBC: 3.6 10*3/uL — ABNORMAL LOW (ref 4.0–10.5)
nRBC: 0 % (ref 0.0–0.2)

## 2023-04-26 ENCOUNTER — Encounter: Payer: Self-pay | Admitting: Oncology

## 2023-04-26 LAB — ESTRADIOL: Estradiol: 12.5 pg/mL

## 2023-04-26 LAB — FSH/LH
FSH: 29 m[IU]/mL
LH: 21.1 m[IU]/mL

## 2023-04-26 NOTE — Progress Notes (Signed)
Hematology/Oncology Consult note [facility]  [phone] Fax:([phone]  Patient Care Team: Kandyce Rud, MD as PCP - General (Family Medicine) Hulen Luster, RN as Oncology Nurse Navigator Carmina Miller, MD as Consulting Physician (Radiation Oncology) Creig Hines, MD as Consulting Physician (Oncology) Earline Mayotte, MD as Consulting Physician (General Surgery)   Name of the patient: Sabrina Oneill  [id number]  30-Oct-1970   Date of visit: 04/26/23  Diagnosis- stage I ER/PR positive HER2 negative left breast cancer    Chief complaint/ Reason for visit-routine follow-up of breast cancer  Heme/Onc history: Patient is a 51 year old female with a prior history of iron deficiency anemia for which she has seen Dr. Orlie Dakin in the past.  She recently underwent a screening mammogram in March 2023 which showed possible distortion in the left breast.  This was followed by diagnostic mammogram and ultrasound which showed hypoechoic mass 12 x 4 x 13 mm in the left breast at the 1 o'clock position.  There was another mass 6 x 8 x 8 mm at the 2 o'clock position 7 cm from the nipple.  No suspicious left axillary adenopathy.  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  The second breast mass was negative for malignancy and consistent with benign fibroadenoma.   Final pathology showed 9 mm grade 1 tubular carcinoma with negative margins ER/PR positive HER2 negative.4 sentinel lymph nodes negative for malignancy.  Patient did not require Oncotype testing or adjuvant chemotherapy.  She completed adjuvant radiation therapy in August 2023.  Patient started taking tamoxifen sometime in September 2023.  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     Interval history-patient is tolerating tamoxifen well and without any significant side effects.  Denies any breast concerns.  Denies any new aches and pains anywhere.  ECOG PS- 0 Pain scale- 0   Review of systems- Review of Systems  Constitutional:  Negative for chills, fever, malaise/fatigue and weight loss.  HENT:  Negative for congestion, ear discharge and nosebleeds.   Eyes:  Negative for blurred vision.  Respiratory:  Negative for cough, hemoptysis, sputum production, shortness of breath and wheezing.   Cardiovascular:  Negative for chest pain, palpitations, orthopnea and claudication.  Gastrointestinal:  Negative for abdominal pain, blood in stool, constipation, diarrhea, heartburn, melena, nausea and vomiting.  Genitourinary:  Negative for dysuria, flank pain, frequency, hematuria and urgency.  Musculoskeletal:  Negative for back pain, joint pain and myalgias.  Skin:  Negative for rash.  Neurological:  Negative for dizziness, tingling, focal weakness, seizures, weakness and headaches.  Endo/Heme/Allergies:  Does not bruise/bleed easily.  Psychiatric/Behavioral:  Negative for depression and suicidal ideas. The patient does not have insomnia.       No Known Allergies   Past Medical History:  Diagnosis Date   Anemia    Cancer ([facility])    left beast     Past Surgical History:  Procedure Laterality Date   ABDOMINAL HYSTERECTOMY     AXILLARY SENTINEL NODE BIOPSY Left 03/13/2022   Procedure: AXILLARY SENTINEL NODE BIOPSY;  Surgeon: Earline Mayotte, MD;  Location: [facility] ORS;  Service: General;  Laterality: Left;   BREAST BIOPSY Left 02/09/2022   [place] bx 2:00 7cmfn heart marker, positive   BREAST BIOPSY Left 02/09/2022  [place] bx, 1:00 10cmfn coil marker, positive   BREAST LUMPECTOMY Left 03/13/2022   Lumpectomy   BREAST LUMPECTOMY WITH NEEDLE LOCALIZATION Left 03/13/2022   Procedure: BREAST LUMPECTOMY WITH NEEDLE LOCALIZATION;  Surgeon: Earline Mayotte, MD;  Location:  [facility] ORS;  Service: General;  Laterality: Left;   CESAREAN SECTION     COLONOSCOPY W/ POLYPECTOMY  2021   DILATION AND CURETTAGE OF UTERUS     2001   WISDOM TOOTH EXTRACTION      Social History   Socioeconomic History   Marital status: Married    Spouse name: Luisa Hart   Number of children: 2   Years of education: Not on file   Highest education level: Not on file  Occupational History   Not on file  Tobacco Use   Smoking status: Never   Smokeless tobacco: Never  Substance and Sexual Activity   Alcohol use: Not Currently    Alcohol/week: 0.0 standard drinks of alcohol   Drug use: Not Currently   Sexual activity: Not on file  Other Topics Concern   Not on file  Social History Narrative   Not on file   Social Determinants of Health   Financial Resource Strain: Not on file  Food Insecurity: Not on file  Transportation Needs: Not on file  Physical Activity: Not on file  Stress: Not on file  Social Connections: Not on file  Intimate Partner Violence: Unknown (11/25/2019)   Received from [facility], [facility]   Humiliation, Afraid, Rape, and Kick questionnaire    Fear of Current or Ex-Partner: Patient declined    Emotionally Abused: Patient declined    Physically Abused: Patient declined    Sexually Abused: Patient declined    Family History  Problem Relation Age of Onset   Multiple myeloma Mother    Breast cancer Maternal Aunt        d. 19s   Prostate cancer Maternal Uncle        metastatic d. 101s   Breast cancer Paternal Grandmother        dx 78s     Current Outpatient Medications:    escitalopram (LEXAPRO) 10 MG tablet, Take 1 tablet (10 mg total) by mouth daily., Disp: 90 tablet, Rfl: 1   ibuprofen (ADVIL) 800 MG tablet, Take 1 tablet (800 mg total) by mouth every 8 (eight) hours as needed., Disp: 90 tablet, Rfl: 1   metroNIDAZOLE (FLAGYL) 500 MG tablet, Take 1 tablet (500 mg total) by mouth 2 (two) times daily for 7 days, Disp: 14 tablet, Rfl: 0    Multiple Vitamin (MULTIVITAMIN) capsule, Take 1 capsule by mouth daily. , Disp: , Rfl:    Multiple Vitamins-Minerals (EMERGEN-C IMMUNE PO), Take 1 packet by mouth daily., Disp: , Rfl:    tamoxifen (NOLVADEX) 20 MG tablet, Take 1 tablet (20 mg total) by mouth daily., Disp: 30 tablet, Rfl: 3   COVID-19 At Home Antigen Test (CARESTART COVID-19 HOME TEST) KIT, use as directed within package instruction (Patient not taking: Reported on 06/26/2022), Disp: 2 kit, Rfl: 0   escitalopram (LEXAPRO) 10 MG tablet, Take 1 tablet (10 mg total) by mouth once daily for 90 days (Patient not taking: Reported on 06/26/2022), Disp: 30 tablet, Rfl: 2   escitalopram (LEXAPRO) 10 MG tablet, Take 1 tablet (10 mg total) by mouth daily. (Patient not taking: Reported on 04/25/2023), Disp: 90 tablet, Rfl: 1  Current Facility-Administered Medications:    betamethasone acetate-betamethasone sodium phosphate (CELESTONE) injection 3 mg, 3 mg, Intra-articular,  Once, Felecia Shelling, [place]  Physical exam:  Vitals:   04/25/23 1416  BP: 119/72  Pulse: 83  Resp: 18  Temp: (!) 96.6 F (35.9 C)  TempSrc: Tympanic  SpO2: 100%  Weight: 199 lb 14.4 oz (90.7 kg)  Height: 5\' 2"  (1.575 m)   Physical Exam Cardiovascular:     Rate and Rhythm: Normal rate and regular rhythm.     Heart sounds: Normal heart sounds.  Pulmonary:     Effort: Pulmonary effort is normal.     Breath sounds: Normal breath sounds.  Abdominal:     General: Bowel sounds are normal.     Palpations: Abdomen is soft.  Skin:    General: Skin is warm and dry.  Neurological:     Mental Status: She is alert and oriented to person, place, and time.    Breast exam was performed in seated and lying down position. Patient is status post left lumpectomy with a well-healed surgical scar. No evidence of any palpable masses. No evidence of axillary adenopathy. No evidence of any palpable masses or lumps in the right breast. No evidence of right axillary adenopathy       Latest Ref Rng & Units 06/26/2022   10:18 AM  CMP  Glucose 70 - 99 mg/dL 109   BUN 6 - 20 mg/dL 11   Creatinine 3.23 - 1.00 mg/dL 5.57   Sodium 322 - 025 mmol/L 136   Potassium 3.5 - 5.1 mmol/L 4.5   Chloride 98 - 111 mmol/L 104   CO2 22 - 32 mmol/L 26   Calcium 8.9 - 10.3 mg/dL 9.1   Total Protein 6.5 - 8.1 g/dL 7.0   Total Bilirubin 0.3 - 1.2 mg/dL 0.5   Alkaline Phos 38 - 126 U/L 59   AST 15 - 41 U/L 15   ALT 0 - 44 U/L 12       Latest Ref Rng & Units 04/25/2023    2:07 PM  CBC  WBC 4.0 - 10.5 K/uL 3.6   Hemoglobin 12.0 - 15.0 g/dL 42.7   Hematocrit 06.2 - 46.0 % 39.3   Platelets 150 - 400 K/uL 196     No images are attached to the encounter.  No results found.   Assessment and plan- Patient is a 52 y.o. female here for routine f/u of breast cancer on tamoxifen  Hormone levels from today are pending but 3 months ago her levels were still premenopausal.  I plan to continue to keep her on tamoxifen at least for another year before checking her levels again.  She is tolerating tamoxifen well without any significant side effects.  Clinically she is doing well without any signs and symptoms of recurrence based on today's exam.  I will see her backIn 6 months no labs.  Her mammogram from April 2024 was unremarkable as well.   Visit Diagnosis 1. Encounter for monitoring tamoxifen therapy   2. Encounter for follow-up surveillance of breast cancer      Dr. Owens Shark, MD, MPH [facility] at [facility] [phone] 04/26/2023 11:16 AM

## 2023-05-23 ENCOUNTER — Other Ambulatory Visit: Payer: Self-pay

## 2023-05-23 ENCOUNTER — Other Ambulatory Visit: Payer: Self-pay | Admitting: Oncology

## 2023-05-23 MED ORDER — TAMOXIFEN CITRATE 20 MG PO TABS
20.0000 mg | ORAL_TABLET | Freq: Every day | ORAL | 3 refills | Status: DC
  Filled 2023-05-23: qty 30, 30d supply, fill #0
  Filled 2023-06-19: qty 30, 30d supply, fill #1
  Filled 2023-10-05: qty 30, 30d supply, fill #2
  Filled 2023-11-08 – 2023-11-09 (×2): qty 30, 30d supply, fill #3

## 2023-05-23 MED ORDER — ESCITALOPRAM OXALATE 10 MG PO TABS
10.0000 mg | ORAL_TABLET | Freq: Every day | ORAL | 1 refills | Status: DC
  Filled 2023-05-23: qty 90, 90d supply, fill #0

## 2023-10-30 ENCOUNTER — Inpatient Hospital Stay: Payer: Commercial Managed Care - PPO | Attending: Oncology | Admitting: Oncology

## 2023-10-30 ENCOUNTER — Encounter: Payer: Self-pay | Admitting: Oncology

## 2023-10-30 VITALS — BP 99/72 | HR 67 | Temp 96.6°F | Resp 18 | Ht 62.0 in | Wt 199.0 lb

## 2023-10-30 DIAGNOSIS — Z79810 Long term (current) use of selective estrogen receptor modulators (SERMs): Secondary | ICD-10-CM

## 2023-10-30 DIAGNOSIS — Z08 Encounter for follow-up examination after completed treatment for malignant neoplasm: Secondary | ICD-10-CM

## 2023-10-30 DIAGNOSIS — Z5181 Encounter for therapeutic drug level monitoring: Secondary | ICD-10-CM

## 2023-10-30 DIAGNOSIS — C50412 Malignant neoplasm of upper-outer quadrant of left female breast: Secondary | ICD-10-CM

## 2023-10-30 DIAGNOSIS — Z170 Estrogen receptor positive status [ER+]: Secondary | ICD-10-CM

## 2023-10-31 ENCOUNTER — Encounter: Payer: Self-pay | Admitting: Oncology

## 2023-10-31 NOTE — Progress Notes (Signed)
Hematology/Oncology Consult note [facility]  Telephone:([phone] [phone]  Patient Care Team: Kandyce Rud, MD as PCP - General (Family Medicine) Hulen Luster, RN as Oncology Nurse Navigator Carmina Miller, MD as Consulting Physician (Radiation Oncology) Creig Hines, MD as Consulting Physician (Oncology) Earline Mayotte, MD as Consulting Physician (General Surgery)   Name of the patient: Sabrina Oneill  [id number]  [birth year]   Date of visit: 10/31/23  Diagnosis-  stage I ER/PR positive HER2 negative left breast cancer   Chief complaint/ Reason for visit- routine f/u of breast cancer  Heme/Onc history: Patient is a 53 year old female with a prior history of iron deficiency anemia for which she has seen Dr. Orlie Dakin in the past.  She recently underwent a screening mammogram in March 2023 which showed possible distortion in the left breast.  This was followed by diagnostic mammogram and ultrasound which showed hypoechoic mass 12 x 4 x 13 mm in the left breast at the 1 o'clock position.  There was another mass 6 x 8 x 8 mm at the 2 o'clock position 7 cm from the nipple.  No suspicious left axillary adenopathy.  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  The second breast mass was negative for malignancy and consistent with benign fibroadenoma.   Final pathology showed 9 mm grade 1 tubular carcinoma with negative margins ER/PR positive HER2 negative.4 sentinel lymph nodes negative for malignancy.  Patient did not require Oncotype testing or adjuvant chemotherapy.  She completed adjuvant radiation therapy in August 2023.  Patient started taking tamoxifen sometime in September 2023.  She is s/p hysterectomy but has ovaries in situ and labs have not been consistently  postmenopausal.  Interval history-she is tolerating tamoxifen well without any significant side effects.  Denies any breast concerns today.  Appetite and weight have remained stable.  Denies any new aches and pains anywhere  ECOG PS- 0 Pain scale- 0   Review of systems- Review of Systems  Constitutional:  Negative for chills, fever, malaise/fatigue and weight loss.  HENT:  Negative for congestion, ear discharge and nosebleeds.   Eyes:  Negative for blurred vision.  Respiratory:  Negative for cough, hemoptysis, sputum production, shortness of breath and wheezing.   Cardiovascular:  Negative for chest pain, palpitations, orthopnea and claudication.  Gastrointestinal:  Negative for abdominal pain, blood in stool, constipation, diarrhea, heartburn, melena, nausea and vomiting.  Genitourinary:  Negative for dysuria, flank pain, frequency, hematuria and urgency.  Musculoskeletal:  Negative for back pain, joint pain and myalgias.  Skin:  Negative for rash.  Neurological:  Negative for dizziness, tingling, focal weakness, seizures, weakness and headaches.  Endo/Heme/Allergies:  Does not bruise/bleed easily.  Psychiatric/Behavioral:  Negative for depression and suicidal ideas. The patient does not have insomnia.       No Known Allergies   Past Medical History:  Diagnosis Date   Anemia    Cancer ([facility])    left beast     Past Surgical History:  Procedure Laterality Date   ABDOMINAL HYSTERECTOMY     AXILLARY SENTINEL NODE BIOPSY Left 03/13/2022   Procedure: AXILLARY SENTINEL NODE BIOPSY;  Surgeon: Earline Mayotte, MD;  Location: [facility] ORS;  Service: General;  Laterality: Left;   BREAST BIOPSY Left 02/09/2022   [place] bx 2:00 7cmfn heart marker, positive  BREAST BIOPSY Left 02/09/2022   [place] bx, 1:00 10cmfn coil marker, positive   BREAST LUMPECTOMY Left 03/13/2022   Lumpectomy   BREAST LUMPECTOMY WITH NEEDLE LOCALIZATION Left 03/13/2022   Procedure: BREAST LUMPECTOMY WITH NEEDLE  LOCALIZATION;  Surgeon: Earline Mayotte, MD;  Location: [facility] ORS;  Service: General;  Laterality: Left;   CESAREAN SECTION     COLONOSCOPY W/ POLYPECTOMY  2021   DILATION AND CURETTAGE OF UTERUS     2001   WISDOM TOOTH EXTRACTION      Social History   Socioeconomic History   Marital status: Married    Spouse name: Luisa Hart   Number of children: 2   Years of education: Not on file   Highest education level: Not on file  Occupational History   Not on file  Tobacco Use   Smoking status: Never   Smokeless tobacco: Never  Substance and Sexual Activity   Alcohol use: Not Currently    Alcohol/week: 0.0 standard drinks of alcohol   Drug use: Not Currently   Sexual activity: Not on file  Other Topics Concern   Not on file  Social History Narrative   Not on file   Social Drivers of Health   Financial Resource Strain: Not on file  Food Insecurity: Not on file  Transportation Needs: Not on file  Physical Activity: Not on file  Stress: Not on file  Social Connections: Not on file  Intimate Partner Violence: Unknown (11/25/2019)   Received from [facility], [facility]   Humiliation, Afraid, Rape, and Kick questionnaire    Fear of Current or Ex-Partner: Patient declined    Emotionally Abused: Patient declined    Physically Abused: Patient declined    Sexually Abused: Patient declined    Family History  Problem Relation Age of Onset   Multiple myeloma Mother    Breast cancer Maternal Aunt        d. 28s   Prostate cancer Maternal Uncle        metastatic d. 69s   Breast cancer Paternal Grandmother        dx 35s     Current Outpatient Medications:    aspirin EC 81 MG tablet, Take by mouth., Disp: , Rfl:    Multiple Vitamin (MULTIVITAMIN) capsule, Take 1 capsule by mouth daily. , Disp: , Rfl:    Multiple Vitamins-Minerals (EMERGEN-C IMMUNE PO), Take 1 packet by mouth daily., Disp: , Rfl:    tamoxifen (NOLVADEX) 20 MG tablet, Take 1 tablet (20 mg total) by mouth  daily., Disp: 30 tablet, Rfl: 3   COVID-19 At Home Antigen Test (CARESTART COVID-19 HOME TEST) KIT, use as directed within package instruction (Patient not taking: Reported on 10/30/2023), Disp: 2 kit, Rfl: 0   escitalopram (LEXAPRO) 10 MG tablet, Take 1 tablet (10 mg total) by mouth daily., Disp: 90 tablet, Rfl: 1   ibuprofen (ADVIL) 800 MG tablet, Take 1 tablet (800 mg total) by mouth every 8 (eight) hours as needed. (Patient not taking: Reported on 10/30/2023), Disp: 90 tablet, Rfl: 1   metroNIDAZOLE (FLAGYL) 500 MG tablet, Take 1 tablet (500 mg total) by mouth 2 (two) times daily for 7 days (Patient not taking: Reported on 10/30/2023), Disp: 14 tablet, Rfl: 0  Current Facility-Administered Medications:    betamethasone acetate-betamethasone sodium phosphate (CELESTONE) injection 3 mg, 3 mg, Intra-articular, Once, Felecia Shelling, DPM  Physical exam:  Vitals:   10/30/23 1411  BP: 99/72  Pulse: 67  Resp: 18  Temp: Marland Kitchen)  96.6 F (35.9 C)  TempSrc: Tympanic  SpO2: 100%  Weight: 199 lb (90.3 kg)  Height: 5\' 2"  (1.575 m)   Physical Exam Cardiovascular:     Rate and Rhythm: Normal rate and regular rhythm.     Heart sounds: Normal heart sounds.  Pulmonary:     Effort: Pulmonary effort is normal.     Breath sounds: Normal breath sounds.  Skin:    General: Skin is warm and dry.  Neurological:     Mental Status: She is alert and oriented to person, place, and time.   Breast exam was performed in seated and lying down position. Patient is status post left lumpectomy with a well-healed surgical scar. No evidence of any palpable masses. No evidence of axillary adenopathy. No evidence of any palpable masses or lumps in the right breast. No evidence of right axillary adenopathy       Latest Ref Rng & Units 06/26/2022   10:18 AM  CMP  Glucose 70 - 99 mg/dL 161   BUN 6 - 20 mg/dL 11   Creatinine 0.96 - 1.00 mg/dL 0.45   Sodium 409 - 811 mmol/L 136   Potassium 3.5 - 5.1 mmol/L 4.5   Chloride  98 - 111 mmol/L 104   CO2 22 - 32 mmol/L 26   Calcium 8.9 - 10.3 mg/dL 9.1   Total Protein 6.5 - 8.1 g/dL 7.0   Total Bilirubin 0.3 - 1.2 mg/dL 0.5   Alkaline Phos 38 - 126 U/L 59   AST 15 - 41 U/L 15   ALT 0 - 44 U/L 12       Latest Ref Rng & Units 04/25/2023    2:07 PM  CBC  WBC 4.0 - 10.5 K/uL 3.6   Hemoglobin 12.0 - 15.0 g/dL 91.4   Hematocrit 78.2 - 46.0 % 39.3   Platelets 150 - 400 K/uL 196      Assessment and plan- Patient is a 53 y.o. female with history of stage I ER/PR positive HER2 negative left breast cancer s/p lumpectomy and adjuvant radiation.  She did not require any adjuvant chemotherapy.  Hormone levels were premenopausal and patient is presently on tamoxifen and this is a routine follow-up visit  Clinically patient is doing well with no concerning signs and symptoms of recurrence based on today's exam.  She will continue with tamoxifen at this time and I will repeat hormone levels in 6 months.  If they are conclusively postmenopausal I will make the switch to aromatase inhibitors.  I will see her back in 6 months.  She will need a mammogram in April 2025 and a bone density scan in May 2025.   Visit Diagnosis 1. Encounter for follow-up surveillance of breast cancer   2. Encounter for monitoring tamoxifen therapy      Dr. Owens Shark, MD, MPH [facility] at [facility] [phone] 10/31/2023 8:46 AM

## 2023-11-05 DIAGNOSIS — E7800 Pure hypercholesterolemia, unspecified: Secondary | ICD-10-CM | POA: Diagnosis not present

## 2023-11-05 DIAGNOSIS — Z79899 Other long term (current) drug therapy: Secondary | ICD-10-CM | POA: Diagnosis not present

## 2023-11-09 ENCOUNTER — Other Ambulatory Visit: Payer: Self-pay

## 2023-11-15 ENCOUNTER — Other Ambulatory Visit: Payer: Self-pay

## 2023-11-15 ENCOUNTER — Encounter: Payer: Self-pay | Admitting: Oncology

## 2023-11-15 DIAGNOSIS — E7800 Pure hypercholesterolemia, unspecified: Secondary | ICD-10-CM | POA: Diagnosis not present

## 2023-11-15 DIAGNOSIS — Z79899 Other long term (current) drug therapy: Secondary | ICD-10-CM | POA: Diagnosis not present

## 2023-11-15 DIAGNOSIS — Z0000 Encounter for general adult medical examination without abnormal findings: Secondary | ICD-10-CM | POA: Diagnosis not present

## 2023-11-15 MED ORDER — ESCITALOPRAM OXALATE 10 MG PO TABS
10.0000 mg | ORAL_TABLET | Freq: Every day | ORAL | 3 refills | Status: AC
  Filled 2023-11-15: qty 90, 90d supply, fill #0
  Filled 2024-03-17: qty 90, 90d supply, fill #1
  Filled 2024-07-14: qty 90, 90d supply, fill #2
  Filled 2024-11-12: qty 90, 90d supply, fill #3

## 2023-11-20 DIAGNOSIS — Z1331 Encounter for screening for depression: Secondary | ICD-10-CM | POA: Diagnosis not present

## 2023-11-20 DIAGNOSIS — Z01419 Encounter for gynecological examination (general) (routine) without abnormal findings: Secondary | ICD-10-CM | POA: Diagnosis not present

## 2023-11-28 ENCOUNTER — Ambulatory Visit: Payer: Commercial Managed Care - PPO | Admitting: Radiation Oncology

## 2023-12-10 IMAGING — MG MM DIGITAL DIAGNOSTIC UNILAT*L* W/ TOMO W/ CAD
6 series · 6 of 18 positions shown · non-contrast
Comparison: Previous exam(s).

CLINICAL DATA: Callback for LEFT breast distortion

EXAM:
DIGITAL DIAGNOSTIC UNILATERAL LEFT MAMMOGRAM WITH TOMOSYNTHESIS AND
CAD; ULTRASOUND LEFT BREAST LIMITED
TECHNIQUE: Left digital diagnostic mammography and breast tomosynthesis was
performed. The images were evaluated with computer-aided detection.;
Targeted ultrasound examination of the left breast was performed.

[L ML synth-2D]
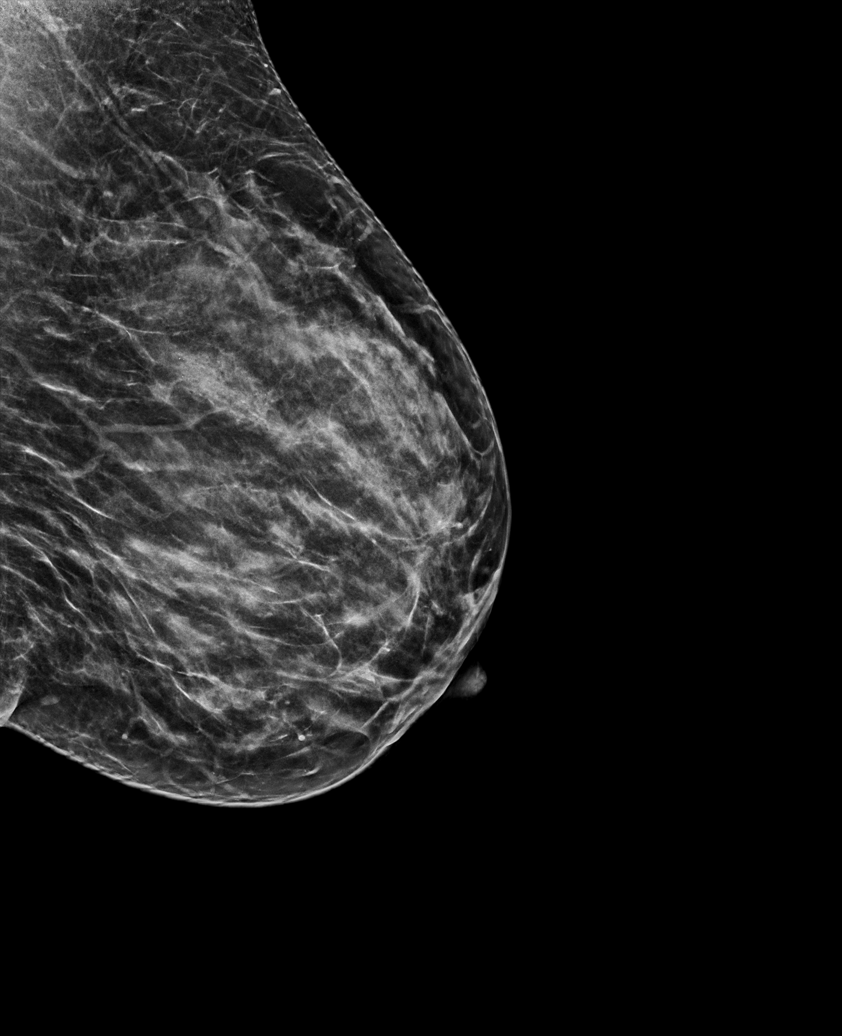

[L MLO synth-2D]
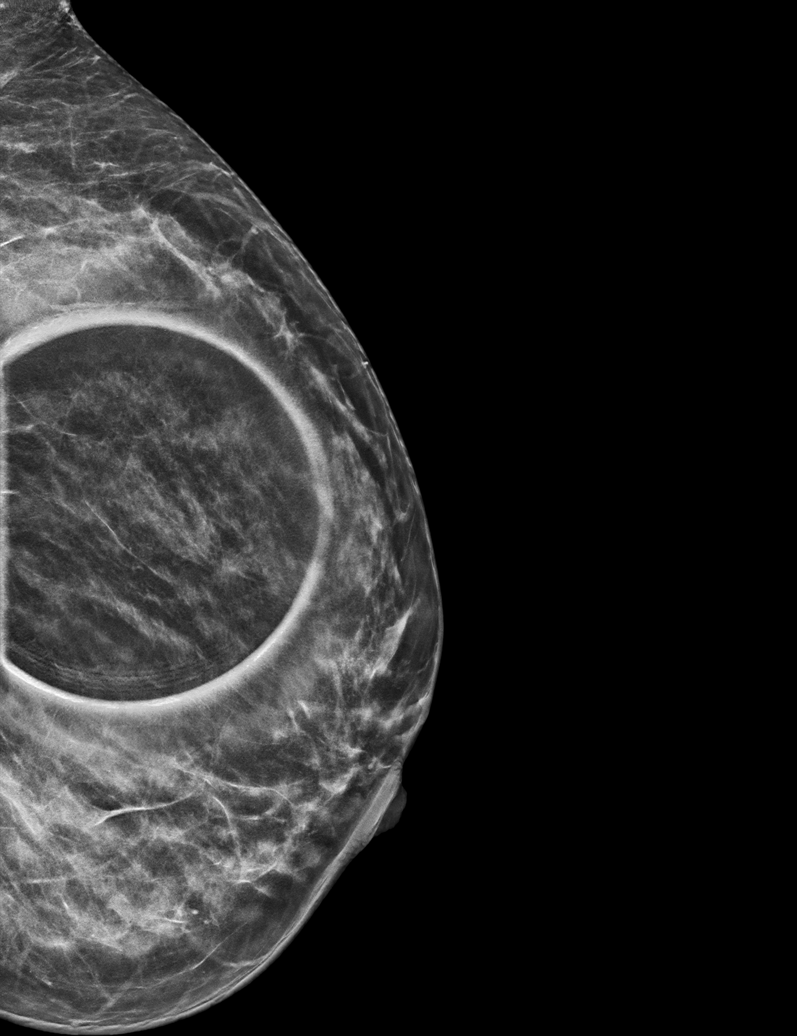

[L CC synth-2D]
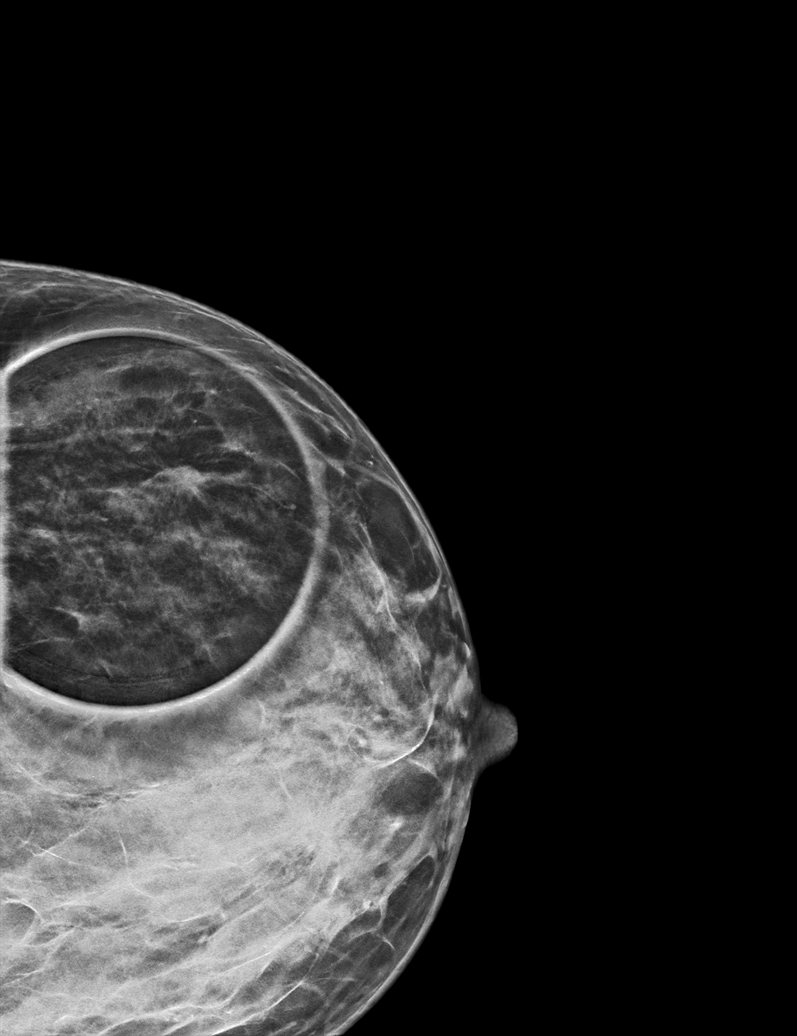

[L CC tomo · tomo slice 21/42.0]
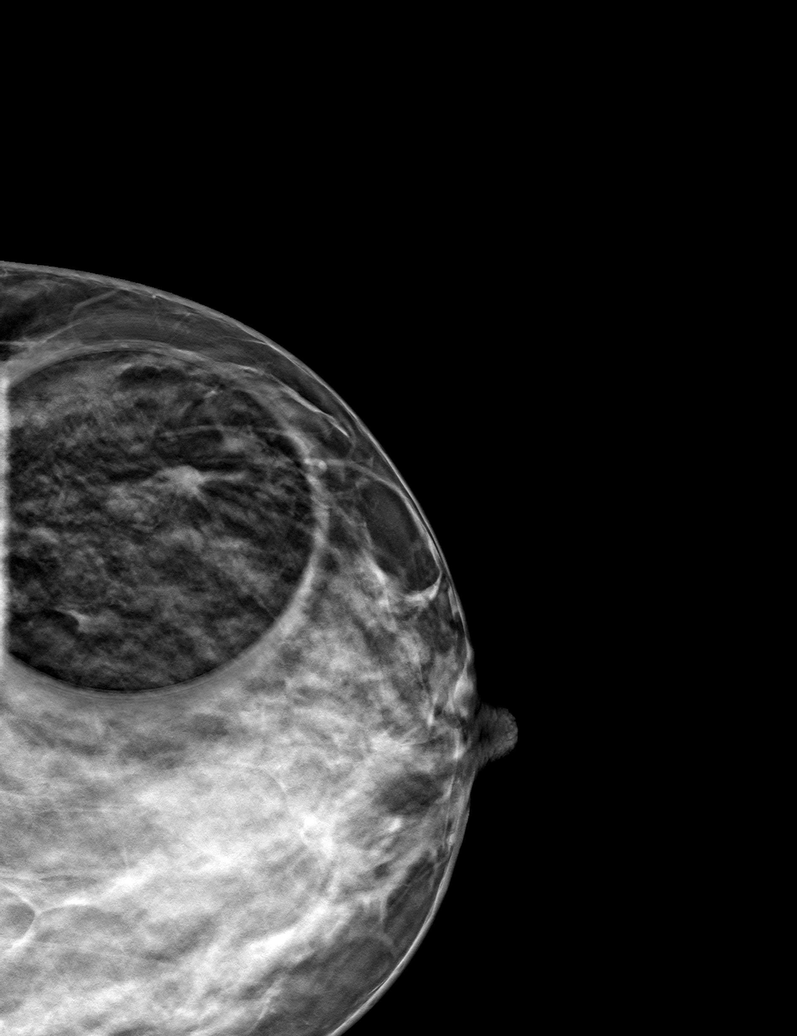

[L MLO tomo · tomo slice 26/51.0]
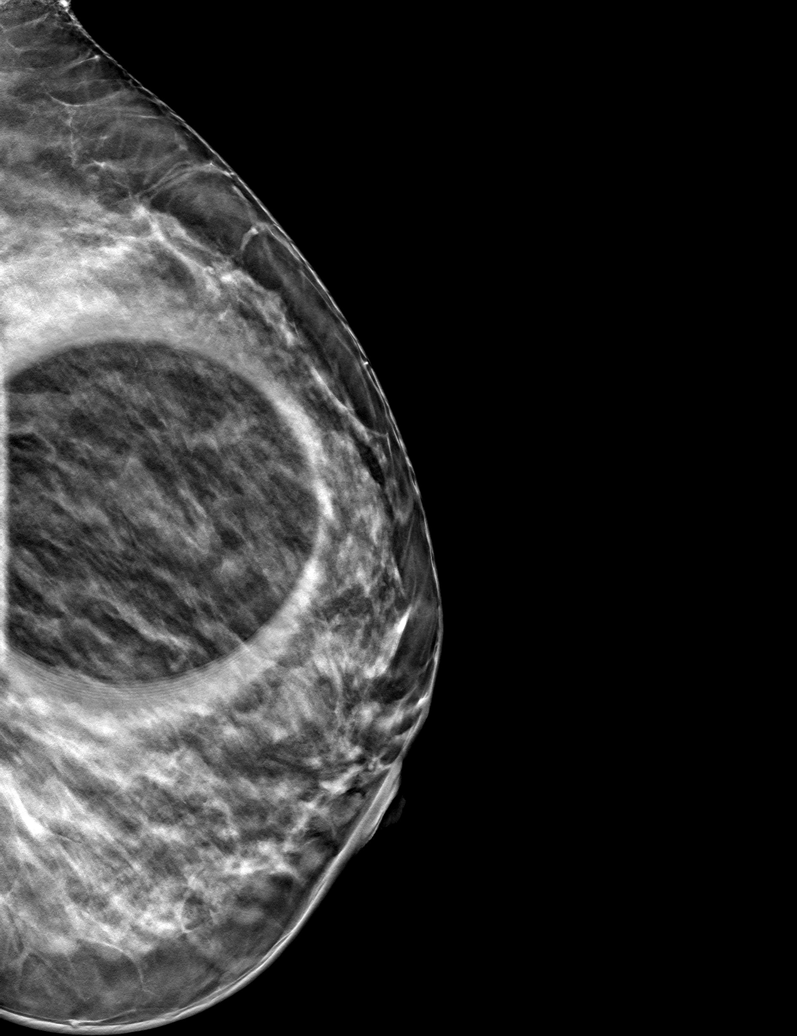

[L ML tomo · tomo slice 30/59.0]
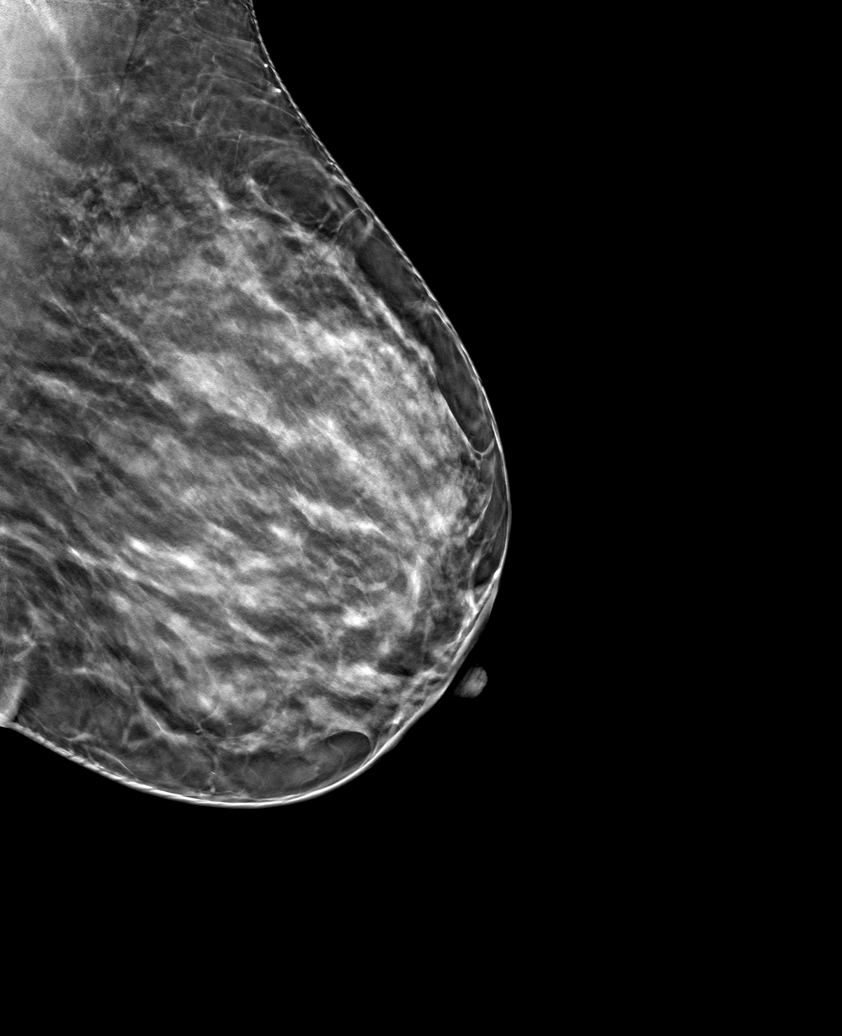

[6 of 18 positions shown; findings below may reference images not displayed]

ACR Breast Density Category c: The breast tissue is heterogeneously
dense, which may obscure small masses.
FINDINGS: Spot compression tomosynthesis views confirm persistence of an
irregular mass with associated architectural distortion in the LEFT
upper outer breast at middle depth. It is best seen on spot CC slice
21, spot MLO slice 38, and ML slice 42. Scattered and occasionally
grouped punctate calcifications are noted throughout the LEFT
breast.

On physical exam, no discrete mass is appreciated.

Targeted ultrasound was performed of the LEFT outer breast. At 2
o'clock 7 cm from the nipple, there is an irregular hypoechoic mass
with irregular margins. It measures 6 x 8 by 8 mm, remeasured. This
corresponds to the site of screening mammographic concern.

During real-time examination, there is incidental sonographic note
of an oval circumscribed hypoechoic mass in the LEFT breast at 1
o'clock 10 cm from the nipple. It measures 12 x 4 x 13 mm. It is
approximately 6 cm away from the suspicious mass noted
mammographically as best estimated by panoramic imaging.

Targeted ultrasound was performed of the LEFT axilla. No suspicious
axillary lymph nodes are seen.
IMPRESSION: 1. An 8 mm mass in the LEFT breast at 2 o'clock 7 cm from the nipple
is concerning for malignancy. Recommend ultrasound-guided biopsy for
definitive characterization.
2. There is incidental sonographic note of a probably benign 13 mm
mass in the LEFT breast at approximately 1 o'clock 10 cm from the
nipple. Given suspicious appearance of mass at 2 o'clock, recommend
ultrasound-guided biopsy of this mass for definitive
characterization.
3. No suspicious LEFT axillary adenopathy.

RECOMMENDATION:
1. LEFT breast ultrasound-guided biopsy x2
2. With malignant results, recommend breast MRI with and without
contrast given young age and heterogeneous breast density with
multiple waxing and waning scattered punctate calcifications noted
mammographically.

I have discussed the findings and recommendations with the patient.
The biopsy procedure was discussed with the patient and questions
were answered. Patient expressed their understanding of the biopsy
recommendation. Patient will be scheduled for biopsy at her earliest
convenience by the schedulers. Ordering provider will be notified.
If applicable, a reminder letter will be sent to the patient
regarding the next appointment.

BI-RADS CATEGORY  5: Highly suggestive of malignancy.

## 2023-12-27 ENCOUNTER — Ambulatory Visit: Payer: Commercial Managed Care - PPO | Admitting: Radiation Oncology

## 2024-01-10 ENCOUNTER — Ambulatory Visit
Admission: RE | Admit: 2024-01-10 | Discharge: 2024-01-10 | Disposition: A | Discharge status: Home / Self Care | Payer: Commercial Managed Care - PPO | Source: Ambulatory Visit | Attending: Oncology | Admitting: Oncology

## 2024-01-10 DIAGNOSIS — R920 Mammographic microcalcification found on diagnostic imaging of breast: Secondary | ICD-10-CM | POA: Diagnosis not present

## 2024-01-10 DIAGNOSIS — Z5181 Encounter for therapeutic drug level monitoring: Secondary | ICD-10-CM | POA: Diagnosis not present

## 2024-01-10 DIAGNOSIS — Z853 Personal history of malignant neoplasm of breast: Secondary | ICD-10-CM | POA: Diagnosis not present

## 2024-01-10 DIAGNOSIS — R921 Mammographic calcification found on diagnostic imaging of breast: Secondary | ICD-10-CM | POA: Diagnosis not present

## 2024-01-10 DIAGNOSIS — Z08 Encounter for follow-up examination after completed treatment for malignant neoplasm: Secondary | ICD-10-CM | POA: Insufficient documentation

## 2024-01-10 DIAGNOSIS — Z79810 Long term (current) use of selective estrogen receptor modulators (SERMs): Secondary | ICD-10-CM | POA: Diagnosis not present

## 2024-01-10 DIAGNOSIS — C50912 Malignant neoplasm of unspecified site of left female breast: Secondary | ICD-10-CM | POA: Diagnosis not present

## 2024-01-10 DIAGNOSIS — R92333 Mammographic heterogeneous density, bilateral breasts: Secondary | ICD-10-CM | POA: Diagnosis not present

## 2024-01-11 ENCOUNTER — Other Ambulatory Visit: Payer: Self-pay | Admitting: Oncology

## 2024-01-11 DIAGNOSIS — R921 Mammographic calcification found on diagnostic imaging of breast: Secondary | ICD-10-CM

## 2024-01-21 ENCOUNTER — Other Ambulatory Visit: Payer: Self-pay | Admitting: Oncology

## 2024-01-21 ENCOUNTER — Other Ambulatory Visit: Payer: Self-pay

## 2024-01-21 ENCOUNTER — Ambulatory Visit
Admission: RE | Admit: 2024-01-21 | Discharge: 2024-01-21 | Disposition: A | Discharge status: Home / Self Care | Source: Ambulatory Visit | Attending: Oncology | Admitting: Oncology

## 2024-01-21 DIAGNOSIS — N6021 Fibroadenosis of right breast: Secondary | ICD-10-CM | POA: Diagnosis not present

## 2024-01-21 DIAGNOSIS — R921 Mammographic calcification found on diagnostic imaging of breast: Secondary | ICD-10-CM | POA: Insufficient documentation

## 2024-01-21 MED ORDER — LIDOCAINE-EPINEPHRINE 1 %-1:100000 IJ SOLN
20.0000 mL | Freq: Once | INTRAMUSCULAR | Status: AC
  Administered 2024-01-21: 20 mL
  Filled 2024-01-21: qty 20

## 2024-01-21 MED ORDER — LIDOCAINE 1 % OPTIME INJ - NO CHARGE
5.0000 mL | Freq: Once | INTRAMUSCULAR | Status: AC
  Administered 2024-01-21: 5 mL
  Filled 2024-01-21: qty 6

## 2024-01-22 ENCOUNTER — Other Ambulatory Visit: Payer: Self-pay

## 2024-01-22 LAB — SURGICAL PATHOLOGY

## 2024-01-23 ENCOUNTER — Other Ambulatory Visit: Payer: Self-pay

## 2024-01-23 ENCOUNTER — Encounter: Payer: Self-pay | Admitting: Oncology

## 2024-01-23 MED FILL — Tamoxifen Citrate Tab 20 MG (Base Equivalent): ORAL | 30 days supply | Qty: 30 | Fill #0 | Status: AC

## 2024-01-24 ENCOUNTER — Other Ambulatory Visit: Payer: Self-pay

## 2024-01-24 DIAGNOSIS — C50412 Malignant neoplasm of upper-outer quadrant of left female breast: Secondary | ICD-10-CM | POA: Diagnosis not present

## 2024-01-24 DIAGNOSIS — Z170 Estrogen receptor positive status [ER+]: Secondary | ICD-10-CM | POA: Diagnosis not present

## 2024-01-29 ENCOUNTER — Telehealth: Payer: Self-pay | Admitting: *Deleted

## 2024-01-29 NOTE — Telephone Encounter (Signed)
 Dentist called to get fax number so they can send medical clearance for their office. I gave them the fax [facility]

## 2024-02-25 ENCOUNTER — Other Ambulatory Visit: Payer: Self-pay

## 2024-02-25 MED FILL — Tamoxifen Citrate Tab 20 MG (Base Equivalent): ORAL | 30 days supply | Qty: 30 | Fill #1 | Status: AC

## 2024-03-04 ENCOUNTER — Other Ambulatory Visit: Payer: Commercial Managed Care - PPO

## 2024-03-31 MED FILL — Tamoxifen Citrate Tab 20 MG (Base Equivalent): ORAL | 30 days supply | Qty: 30 | Fill #2 | Status: AC

## 2024-04-08 ENCOUNTER — Ambulatory Visit
Admission: RE | Admit: 2024-04-08 | Discharge: 2024-04-08 | Disposition: A | Discharge status: Home / Self Care | Source: Ambulatory Visit | Attending: Oncology | Admitting: Oncology

## 2024-04-08 DIAGNOSIS — Z853 Personal history of malignant neoplasm of breast: Secondary | ICD-10-CM | POA: Diagnosis not present

## 2024-04-08 DIAGNOSIS — Z79810 Long term (current) use of selective estrogen receptor modulators (SERMs): Secondary | ICD-10-CM | POA: Diagnosis not present

## 2024-04-08 DIAGNOSIS — Z5181 Encounter for therapeutic drug level monitoring: Secondary | ICD-10-CM | POA: Diagnosis not present

## 2024-04-08 DIAGNOSIS — M8589 Other specified disorders of bone density and structure, multiple sites: Secondary | ICD-10-CM | POA: Diagnosis not present

## 2024-04-08 DIAGNOSIS — Z780 Asymptomatic menopausal state: Secondary | ICD-10-CM | POA: Diagnosis not present

## 2024-04-08 DIAGNOSIS — Z08 Encounter for follow-up examination after completed treatment for malignant neoplasm: Secondary | ICD-10-CM | POA: Insufficient documentation

## 2024-04-29 ENCOUNTER — Inpatient Hospital Stay: Payer: Commercial Managed Care - PPO | Admitting: Nurse Practitioner

## 2024-04-29 ENCOUNTER — Encounter: Payer: Self-pay | Admitting: Nurse Practitioner

## 2024-04-29 ENCOUNTER — Inpatient Hospital Stay: Payer: Commercial Managed Care - PPO | Attending: Nurse Practitioner

## 2024-04-29 VITALS — BP 102/70 | HR 75 | Temp 98.6°F | Resp 20 | Wt 202.0 lb

## 2024-04-29 DIAGNOSIS — Z5181 Encounter for therapeutic drug level monitoring: Secondary | ICD-10-CM

## 2024-04-29 DIAGNOSIS — Z08 Encounter for follow-up examination after completed treatment for malignant neoplasm: Secondary | ICD-10-CM

## 2024-04-29 DIAGNOSIS — Z79810 Long term (current) use of selective estrogen receptor modulators (SERMs): Secondary | ICD-10-CM | POA: Diagnosis not present

## 2024-04-29 DIAGNOSIS — Z853 Personal history of malignant neoplasm of breast: Secondary | ICD-10-CM | POA: Insufficient documentation

## 2024-04-29 DIAGNOSIS — M8580 Other specified disorders of bone density and structure, unspecified site: Secondary | ICD-10-CM

## 2024-04-29 NOTE — Progress Notes (Signed)
 Patient states the tamoxifen  gives her hot flashes. Patient states she also is having some concerns with her vision. Patient states her vision goes blurry sometimes.

## 2024-04-29 NOTE — Progress Notes (Signed)
 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 Jacobo in the past.  She recently underwent a screening mammogram in March 2023 which showed possible distortion in the left breast.  This was followed by diagnostic mammogram and ultrasound which showed hypoechoic mass 12 x 4 x 13 mm in the left breast at the 1 o'clock position.  There was another mass 6 x 8 x 8 mm at the 2 o'clock position 7 cm from the nipple.  No suspicious left axillary adenopathy.  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  The second breast mass was negative for malignancy and consistent with benign fibroadenoma.   Final pathology showed 9 mm grade 1 tubular carcinoma with negative margins ER/PR positive HER2 negative.4 sentinel lymph nodes negative for malignancy.  Patient did not require Oncotype testing or adjuvant chemotherapy.  She completed adjuvant radiation therapy in August 2023.  Patient started taking tamoxifen  sometime in September 2023.  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  Interval  History- Sabrina Oneill is 53 y.o. female who returns to clinic for follow up. She continues to tolerate tamoxifen  well without significant side effects. She denies breast lumps, masses, or pain. Appetite and weight are stable. Denies new pains or headaches.    ECOG PS- 0 Pain scale- 0  Review of systems- Review of Systems  Constitutional:  Negative for chills, fever, malaise/fatigue and weight loss.  HENT:  Negative for congestion, ear discharge and nosebleeds.   Eyes:  Negative for blurred vision.  Respiratory:  Negative for cough, hemoptysis, sputum production, shortness of breath and wheezing.   Cardiovascular:  Negative for chest pain, palpitations, orthopnea and claudication.  Gastrointestinal:  Negative for abdominal pain, blood in stool, constipation, diarrhea, heartburn, melena, nausea and vomiting.  Genitourinary:  Negative for dysuria, flank pain, frequency, hematuria and urgency.  Musculoskeletal:  Negative for back pain, joint pain and myalgias.  Skin:  Negative for rash.  Neurological:  Negative for dizziness, tingling, focal weakness, seizures, weakness and headaches.  Endo/Heme/Allergies:  Does not bruise/bleed easily.  Psychiatric/Behavioral:  Negative for depression and suicidal ideas. The patient does not have insomnia.     No Known Allergies  Past Medical History:  Diagnosis Date   Anemia    Cancer (HCC)    left beast   Past Surgical History:  Procedure Laterality Date   ABDOMINAL HYSTERECTOMY     AXILLARY SENTINEL NODE BIOPSY Left 03/13/2022   Procedure: AXILLARY SENTINEL NODE BIOPSY;  Surgeon: Dessa Reyes ORN, MD;  Location: [facility] ORS;  Service: General;  Laterality: Left;   BREAST BIOPSY Left 02/09/2022   us  bx 2:00 7cmfn heart marker, positive  BREAST BIOPSY Left 02/09/2022   us  bx, 1:00 10cmfn coil marker, positive   BREAST BIOPSY Right 01/21/2024   right breast stereo calcs x clip path pending   BREAST BIOPSY Right 01/21/2024   MM RT BREAST BX W  LOC DEV 1ST LESION IMAGE BX SPEC STEREO GUIDE 01/21/2024 [facility]   BREAST LUMPECTOMY Left 03/13/2022   Lumpectomy   BREAST LUMPECTOMY WITH NEEDLE LOCALIZATION Left 03/13/2022   Procedure: BREAST LUMPECTOMY WITH NEEDLE LOCALIZATION;  Surgeon: Dessa Reyes ORN, MD;  Location: [facility] ORS;  Service: General;  Laterality: Left;   CESAREAN SECTION     COLONOSCOPY W/ POLYPECTOMY  2021   DILATION AND CURETTAGE OF UTERUS     2001   WISDOM TOOTH EXTRACTION     Social History   Socioeconomic History   Marital status: Married    Spouse name: Belvie   Number of children: 2   Years of education: Not on file   Highest education level: Not on file  Occupational History   Not on file  Tobacco Use   Smoking status: Never   Smokeless tobacco: Never  Substance and Sexual Activity   Alcohol use: Not Currently    Alcohol/week: 0.0 standard drinks of alcohol   Drug use: Not Currently   Sexual activity: Not on file  Other Topics Concern   Not on file  Social History Narrative   Not on file   Social Drivers of Health   Financial Resource Strain: Low Risk  (11/20/2023)   Received from [facility]   Overall Financial Resource Strain (CARDIA)    Difficulty of Paying Living Expenses: Not hard at all  Food Insecurity: No Food Insecurity (11/20/2023)   Received from [facility]   Hunger Vital Sign    Within the past 12 months, you worried that your food would run out before you got the money to buy more.: Never true    Within the past 12 months, the food you bought just didn't last and you didn't have money to get more.: Never true  Transportation Needs: No Transportation Needs (11/20/2023)   Received from [facility] - Transportation    In the past 12 months, has lack of transportation kept you from medical appointments or from getting medications?: No    Lack of Transportation (Non-Medical): No  Physical Activity: Not on file   Stress: Not on file  Social Connections: Not on file  Intimate Partner Violence: Unknown (11/25/2019)   Received from [facility]   Humiliation, Afraid, Rape, and Kick questionnaire    Within the last year, have you been afraid of your partner or ex-partner?: Patient declined    Within the last year, have you been humiliated or emotionally abused in other ways by your partner or ex-partner?: Patient declined    Within the last year, have you been kicked, hit, slapped, or otherwise physically hurt by your partner or ex-partner?: Patient declined    Within the last year, have you been raped or forced to have any kind of sexual activity by your partner or ex-partner?: Patient declined   Family History  Problem Relation Age of Onset   Multiple myeloma Mother    Breast cancer Maternal Aunt        d. 17s   Prostate cancer Maternal Uncle        metastatic d. 54s   Breast cancer Paternal Grandmother        dx 70s  Current Outpatient Medications:    aspirin EC 81 MG tablet, Take by mouth., Disp: , Rfl:    escitalopram  (LEXAPRO ) 10 MG tablet, Take 1 tablet (10 mg total) by mouth daily., Disp: 90 tablet, Rfl: 1   escitalopram  (LEXAPRO ) 10 MG tablet, Take 1 tablet (10 mg total) by mouth daily., Disp: 90 tablet, Rfl: 3   Multiple Vitamin (MULTIVITAMIN) capsule, Take 1 capsule by mouth daily. , Disp: , Rfl:    Multiple Vitamins-Minerals (EMERGEN-C IMMUNE PO), Take 1 packet by mouth daily., Disp: , Rfl:    tamoxifen  (NOLVADEX ) 20 MG tablet, Take 1 tablet (20 mg total) by mouth daily., Disp: 30 tablet, Rfl: 3   COVID-19 At Home Antigen Test (CARESTART COVID-19 HOME TEST) KIT, use as directed within package instruction (Patient not taking: Reported on 04/29/2024), Disp: 2 kit, Rfl: 0   ibuprofen  (ADVIL ) 800 MG tablet, Take 1 tablet (800 mg total) by mouth every 8 (eight) hours as needed. (Patient not taking: Reported on 04/29/2024), Disp: 90 tablet, Rfl: 1   metroNIDAZOLE  (FLAGYL ) 500 MG tablet, Take  1 tablet (500 mg total) by mouth 2 (two) times daily for 7 days (Patient not taking: Reported on 04/29/2024), Disp: 14 tablet, Rfl: 0  Current Facility-Administered Medications:    betamethasone  acetate-betamethasone  sodium phosphate  (CELESTONE ) injection 3 mg, 3 mg, Intra-articular, Once, Janit Thresa HERO, DPM  Physical exam:  Vitals:   04/29/24 1413  BP: 102/70  Pulse: 75  Resp: 20  Temp: 98.6 F (37 C)  SpO2: 100%  Weight: 202 lb (91.6 kg)   Physical Exam Vitals reviewed.  Constitutional:      Appearance: She is not ill-appearing.  Cardiovascular:     Rate and Rhythm: Normal rate and regular rhythm.     Heart sounds: Normal heart sounds.  Pulmonary:     Effort: Pulmonary effort is normal.  Chest:     Comments: Breast exam was performed in seated and lying down position. Patient is status post left lumpectomy with a well-healed surgical scar. No evidence of any palpable masses. No evidence of axillary adenopathy. No evidence of any palpable masses or lumps in the right breast. No evidence of right axillary adenopathy Abdominal:     General: There is no distension.  Skin:    General: Skin is warm and dry.  Neurological:     Mental Status: She is alert and oriented to person, place, and time.  Psychiatric:        Mood and Affect: Mood normal.        Behavior: Behavior normal.    Assessment and plan- Patient is a 53 y.o. 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 She did not require any adjuvant chemotherapy.  Hormone levels were premenopausal and patient is presently on tamoxifen . Levels today are pending. She held her tamoxifen  due to vision changes. Ok to hold. If FSH consistent with postmenopausal status will plan to switch to letrozole . If premenopausal, she will restart tamoxifen . Asymptomatic of recurrence today. Clinically no evidence of recurrence. Her last  mammogram in April 2025 was bi-rads 4. Biopsy was consistent with sclerosing adenosis with associated calcifications, negative for atypica or malignancy. Plan to repeat mammogram in April 2026.  Vision changes- recommend she schedule appointment with her eye doctor.  Osteopenia- baseline bone density with t score of -2.1 on 02/27/22. Repeat BMD 04/08/24 stable. Plan to repeat July 2027.  If we switch to AI, may need to consider bisphosphonates.   Disposition:  6 mo- Dr Melanee for breast surveillance- [place]   Visit Diagnosis 1. Encounter for follow-up surveillance of breast cancer   2. Encounter for monitoring tamoxifen  therapy   3. Osteopenia, unspecified location    Tinnie Dawn, DNP, AGNP-C, [facility] [phone] (clinic) 04/29/2024

## 2024-04-30 LAB — FOLLICLE STIMULATING HORMONE: FSH: 34.2 m[IU]/mL

## 2024-04-30 LAB — ESTRADIOL: Estradiol: <5 pg/mL

## 2024-05-11 ENCOUNTER — Ambulatory Visit: Payer: Self-pay | Admitting: Oncology

## 2024-05-13 ENCOUNTER — Other Ambulatory Visit: Payer: Self-pay

## 2024-05-13 ENCOUNTER — Encounter: Payer: Self-pay | Admitting: Oncology

## 2024-05-13 MED ORDER — LETROZOLE 2.5 MG PO TABS
2.5000 mg | ORAL_TABLET | Freq: Every day | ORAL | 3 refills | Status: AC
  Filled 2024-05-13: qty 30, 30d supply, fill #0

## 2024-05-13 NOTE — Telephone Encounter (Signed)
 Per Dr. Melanee Her levels are post menopausal. She can switch to letrozole  at this time. Outbound call; informed of above. Patient requested Letrozole  to be sent to [facility] pharmacy.  Has no further questions / concerns at this time.

## 2024-05-13 NOTE — Telephone Encounter (Signed)
-----   Message from Annah JAYSON Skene sent at 05/11/2024  2:08 PM EDT ----- Her levels are post menopausal. She can switch to letrozole  at this time ----- Message ----- From: Interface, [facility] [place] Sent: 04/30/2024   8:36 AM EDT To: Annah JAYSON Skene, MD

## 2024-05-23 ENCOUNTER — Other Ambulatory Visit: Payer: Self-pay

## 2024-05-28 MED FILL — Tamoxifen Citrate Tab 20 MG (Base Equivalent): ORAL | 30 days supply | Qty: 30 | Fill #3 | Status: AC

## 2024-06-13 ENCOUNTER — Encounter: Payer: Self-pay | Admitting: Oncology

## 2024-07-16 ENCOUNTER — Other Ambulatory Visit: Payer: Self-pay

## 2024-09-23 ENCOUNTER — Other Ambulatory Visit: Payer: Self-pay

## 2024-09-23 ENCOUNTER — Other Ambulatory Visit: Payer: Self-pay | Admitting: Oncology

## 2024-09-24 ENCOUNTER — Other Ambulatory Visit: Payer: Self-pay

## 2024-09-24 ENCOUNTER — Encounter: Payer: Self-pay | Admitting: Oncology

## 2024-09-24 MED FILL — Tamoxifen Citrate Tab 20 MG (Base Equivalent): ORAL | 30 days supply | Qty: 30 | Fill #0 | Status: AC

## 2024-10-30 ENCOUNTER — Other Ambulatory Visit: Payer: Self-pay

## 2024-10-30 DIAGNOSIS — Z5181 Encounter for therapeutic drug level monitoring: Secondary | ICD-10-CM

## 2024-10-30 DIAGNOSIS — C50919 Malignant neoplasm of unspecified site of unspecified female breast: Secondary | ICD-10-CM

## 2024-10-31 ENCOUNTER — Other Ambulatory Visit: Payer: Self-pay

## 2024-10-31 ENCOUNTER — Inpatient Hospital Stay: Attending: Oncology | Admitting: Oncology

## 2024-10-31 ENCOUNTER — Encounter: Payer: Self-pay | Admitting: Oncology

## 2024-10-31 VITALS — BP 116/72 | HR 71 | Temp 98.7°F | Resp 18 | Ht 62.0 in | Wt 202.8 lb

## 2024-10-31 DIAGNOSIS — Z853 Personal history of malignant neoplasm of breast: Secondary | ICD-10-CM | POA: Diagnosis not present

## 2024-10-31 DIAGNOSIS — Z5181 Encounter for therapeutic drug level monitoring: Secondary | ICD-10-CM

## 2024-10-31 DIAGNOSIS — Z170 Estrogen receptor positive status [ER+]: Secondary | ICD-10-CM | POA: Insufficient documentation

## 2024-10-31 DIAGNOSIS — Z79810 Long term (current) use of selective estrogen receptor modulators (SERMs): Secondary | ICD-10-CM | POA: Diagnosis not present

## 2024-10-31 DIAGNOSIS — Z08 Encounter for follow-up examination after completed treatment for malignant neoplasm: Secondary | ICD-10-CM

## 2024-10-31 DIAGNOSIS — C50412 Malignant neoplasm of upper-outer quadrant of left female breast: Secondary | ICD-10-CM | POA: Insufficient documentation

## 2024-10-31 DIAGNOSIS — Z923 Personal history of irradiation: Secondary | ICD-10-CM | POA: Insufficient documentation

## 2024-10-31 DIAGNOSIS — Z1732 Human epidermal growth factor receptor 2 negative status: Secondary | ICD-10-CM | POA: Insufficient documentation

## 2024-10-31 DIAGNOSIS — M8580 Other specified disorders of bone density and structure, unspecified site: Secondary | ICD-10-CM | POA: Insufficient documentation

## 2024-10-31 DIAGNOSIS — Z1721 Progesterone receptor positive status: Secondary | ICD-10-CM | POA: Insufficient documentation

## 2024-10-31 NOTE — Progress Notes (Signed)
 "    Hematology/Oncology Consult note [facility]  [phone] [phone]  Patient Care Team: Diedra Lame, MD as PCP - General (Family Medicine) Georgina Shasta POUR, RN as Oncology Nurse Navigator Lenn Aran, MD as Consulting Physician (Radiation Oncology) Melanee Annah BROCKS, MD as Consulting Physician (Oncology) Dessa Reyes ORN, MD as Consulting Physician (General Surgery)   Name of the patient: Sabrina Oneill  [id number]  09-29-1971   Date of visit: 10/31/24  Diagnosis-  Cancer Staging  Malignant neoplasm of upper-outer quadrant of left breast in female, estrogen receptor positive (HCC) Staging form: Breast, AJCC 8th Edition - Clinical stage from 02/26/2022: Stage IA (cT1b, cN0, cM0, G1, ER+, PR+, HER2-) - Signed by Melanee Annah BROCKS, MD on 02/26/2022 Histologic grading system: 3 grade system - Pathologic stage from 03/24/2022: Stage IA (pT1b, pN0, cM0, G1, ER+, PR+, HER2-) - Signed by Melanee Annah BROCKS, MD on 03/24/2022 Multigene prognostic tests performed: None Histologic grading system: 3 grade system    Chief complaint/ Reason for visit-routine follow-up of breast cancer  Heme/Onc history: Patient is a 54 year old female with a prior history of iron  deficiency anemia for which she has seen Dr. Jacobo in the past.  She recently underwent a screening mammogram in March 2023 which showed possible distortion in the left breast.  This was followed by diagnostic mammogram and ultrasound which showed hypoechoic mass 12 x 4 x 13 mm in the left breast at the 1 o'clock position.  There was another mass 6 x 8 x 8 mm at the 2 o'clock position 7 cm from the nipple.  No suspicious left axillary adenopathy.  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  The second breast mass was negative for malignancy and  consistent with benign fibroadenoma.   Final pathology showed 9 mm grade 1 tubular carcinoma with negative margins ER/PR positive HER2 negative.4 sentinel lymph nodes negative for malignancy.  Patient did not require Oncotype testing or adjuvant chemotherapy.  She completed adjuvant radiation therapy in August 2023.  Patient started taking tamoxifen  sometime in September 2023.  She is s/p hysterectomy but has ovaries in situ.  Hormone levels subsequently were postmenopausal but patient has decided to remain on tamoxifen  and not switch to letrozole  which was originally planned  Interval history-overall patient is doing well and has remained compliant with tamoxifen .  She does not take calcium and vitamin D consistently.  Denies any breast concerns.  Denies any changes in her appetite or weight.  ECOG PS- 0 Pain scale- 0   Review of systems- Review of Systems  Constitutional:  Negative for chills, fever, malaise/fatigue and weight loss.  HENT:  Negative for congestion, ear discharge and nosebleeds.   Eyes:  Negative for blurred vision.  Respiratory:  Negative for cough, hemoptysis, sputum production, shortness of breath and wheezing.   Cardiovascular:  Negative for chest pain, palpitations, orthopnea and claudication.  Gastrointestinal:  Negative for abdominal pain, blood in stool, constipation, diarrhea, heartburn, melena, nausea and vomiting.  Genitourinary:  Negative for dysuria, flank pain, frequency, hematuria and urgency.  Musculoskeletal:  Negative for back pain, joint pain and myalgias.  Skin:  Negative for rash.  Neurological:  Negative for dizziness, tingling, focal weakness, seizures, weakness and headaches.  Endo/Heme/Allergies:  Does not bruise/bleed easily.  Psychiatric/Behavioral:  Negative for depression and suicidal ideas. The patient does  not have insomnia.       Allergies[1]   Past Medical History:  Diagnosis Date   Anemia    Cancer (HCC)    left beast     Past  Surgical History:  Procedure Laterality Date   ABDOMINAL HYSTERECTOMY     AXILLARY SENTINEL NODE BIOPSY Left 03/13/2022   Procedure: AXILLARY SENTINEL NODE BIOPSY;  Surgeon: Dessa Reyes ORN, MD;  Location: [facility] ORS;  Service: General;  Laterality: Left;   BREAST BIOPSY Left 02/09/2022   us  bx 2:00 7cmfn heart marker, positive   BREAST BIOPSY Left 02/09/2022   us  bx, 1:00 10cmfn coil marker, positive   BREAST BIOPSY Right 01/21/2024   right breast stereo calcs x clip path pending   BREAST BIOPSY Right 01/21/2024   MM RT BREAST BX W LOC DEV 1ST LESION IMAGE BX SPEC STEREO GUIDE 01/21/2024 [facility]   BREAST LUMPECTOMY Left 03/13/2022   Lumpectomy   BREAST LUMPECTOMY WITH NEEDLE LOCALIZATION Left 03/13/2022   Procedure: BREAST LUMPECTOMY WITH NEEDLE LOCALIZATION;  Surgeon: Dessa Reyes ORN, MD;  Location: [facility] ORS;  Service: General;  Laterality: Left;   CESAREAN SECTION     COLONOSCOPY W/ POLYPECTOMY  2021   DILATION AND CURETTAGE OF UTERUS     2001   WISDOM TOOTH EXTRACTION      Social History   Socioeconomic History   Marital status: Married    Spouse name: Belvie   Number of children: 2   Years of education: Not on file   Highest education level: Not on file  Occupational History   Not on file  Tobacco Use   Smoking status: Never   Smokeless tobacco: Never  Substance and Sexual Activity   Alcohol use: Not Currently    Alcohol/week: 0.0 standard drinks of alcohol   Drug use: Not Currently   Sexual activity: Not on file  Other Topics Concern   Not on file  Social History Narrative   Not on file   Social Drivers of Health   Tobacco Use: Low Risk (10/31/2024)   Patient History    Smoking Tobacco Use: Never    Smokeless Tobacco Use: Never    Passive Exposure: Not on file  Financial Resource Strain: Low Risk  (11/20/2023)   Received from [facility]   Overall Financial Resource Strain (CARDIA)    Difficulty of Paying Living Expenses: Not  hard at all  Food Insecurity: No Food Insecurity (11/20/2023)   Received from [facility]   Epic    Within the past 12 months, you worried that your food would run out before you got the money to buy more.: Never true    Within the past 12 months, the food you bought just didn't last and you didn't have money to get more.: Never true  Transportation Needs: No Transportation Needs (11/20/2023)   Received from [facility] - Transportation    In the past 12 months, has lack of transportation kept you from medical appointments or from getting medications?: No    Lack of Transportation (Non-Medical): No  Physical Activity: Not on file  Stress: Not on file  Social Connections: Not on file  Intimate Partner Violence: Not on file  Depression (EYV7-0): Low Risk (10/31/2024)   Depression (PHQ2-9)    PHQ-2 Score: 0  Alcohol Screen: Not on file  Housing: Low Risk  (11/20/2023)   Received from [facility]   Epic    In the  last 12 months, was there a time when you were not able to pay the mortgage or rent on time?: No    In the past 12 months, how many times have you moved where you were living?: 0    At any time in the past 12 months, were you homeless or living in a shelter (including now)?: No  Utilities: Not At Risk (11/20/2023)   Received from [facility]    Threatened with loss of utilities: No  Health Literacy: Not on file    Family History  Problem Relation Age of Onset   Multiple myeloma Mother    Breast cancer Maternal Aunt        d. 41s   Prostate cancer Maternal Uncle        metastatic d. 70s   Breast cancer Paternal Grandmother        dx 2s    Current Medications[2]  Physical exam:  Vitals:   10/31/24 1403  BP: 116/72  Pulse: 71  Resp: 18  Temp: 98.7 F (37.1 C)  TempSrc: Tympanic  SpO2: 100%  Weight: 202 lb 12.8 oz (92 kg)  Height: 5' 2 (1.575 m)   Physical  Exam Cardiovascular:     Rate and Rhythm: Normal rate and regular rhythm.     Heart sounds: Normal heart sounds.  Pulmonary:     Effort: Pulmonary effort is normal.     Breath sounds: Normal breath sounds.  Skin:    General: Skin is warm and dry.  Neurological:     Mental Status: She is alert and oriented to person, place, and time.    Breast exam was performed in seated and lying down position. Patient is status post left lumpectomy with a well-healed surgical scar. No evidence of any palpable masses. No evidence of axillary adenopathy. No evidence of any palpable masses or lumps in the right breast. No evidence of right axillary adenopathy   I have personally reviewed labs listed below:    Latest Ref Rng & Units 06/26/2022   10:18 AM  CMP  Glucose 70 - 99 mg/dL 897   BUN 6 - 20 mg/dL 11   Creatinine 9.55 - 1.00 mg/dL 9.11   Sodium 864 - 854 mmol/L 136   Potassium 3.5 - 5.1 mmol/L 4.5   Chloride 98 - 111 mmol/L 104   CO2 22 - 32 mmol/L 26   Calcium 8.9 - 10.3 mg/dL 9.1   Total Protein 6.5 - 8.1 g/dL 7.0   Total Bilirubin 0.3 - 1.2 mg/dL 0.5   Alkaline Phos 38 - 126 U/L 59   AST 15 - 41 U/L 15   ALT 0 - 44 U/L 12       Latest Ref Rng & Units 04/25/2023    2:07 PM  CBC  WBC 4.0 - 10.5 K/uL 3.6   Hemoglobin 12.0 - 15.0 g/dL 86.9   Hematocrit 63.9 - 46.0 % 39.3   Platelets 150 - 400 K/uL 196      Assessment and plan- Patient is a 54 y.o. female with history of stage I ER/PR positive HER2 negative left breast cancer s/p lumpectomy and adjuvant radiation. She did not require any adjuvant chemotherapy.  She is presently on tamoxifen  here for routine follow-up  Assessment and Plan    Breast cancer left ER positive HER2 negative: Clinically patient is doing well with no concerning signs and symptoms of recurrence based on today's exam. She continued tamoxifen , tolerating it  well, with soreness due to scar tissue. Letrozole  was not initiated. - Continued tamoxifen  therapy. -  Discontinued letrozole  prescription. - Follow-up in six months.  Osteopenia Osteopenia confirmed on bone density scan. She was not on calcium or vitamin D supplementation. - Advised to consistently take calcium and vitamin D supplementation. - Repeat bone density scan in one year.         Visit Diagnosis 1. Encounter for follow-up surveillance of breast cancer   2. Encounter for monitoring tamoxifen  therapy      Dr. Annah Skene, MD, MPH [facility] at [facility] [phone] 10/31/2024 2:29 PM                   [1] No Known Allergies [2]  Current Outpatient Medications:    aspirin EC 81 MG tablet, Take by mouth., Disp: , Rfl:    escitalopram  (LEXAPRO ) 10 MG tablet, Take 1 tablet (10 mg total) by mouth daily., Disp: 90 tablet, Rfl: 3   Multiple Vitamin (MULTIVITAMIN) capsule, Take 1 capsule by mouth daily. , Disp: , Rfl:    tamoxifen  (NOLVADEX ) 20 MG tablet, Take 1 tablet (20 mg total) by mouth daily., Disp: 30 tablet, Rfl: 3   COVID-19 At Home Antigen Test (CARESTART COVID-19 HOME TEST) KIT, use as directed within package instruction (Patient not taking: Reported on 04/29/2024), Disp: 2 kit, Rfl: 0   ibuprofen  (ADVIL ) 800 MG tablet, Take 1 tablet (800 mg total) by mouth every 8 (eight) hours as needed. (Patient not taking: Reported on 04/29/2024), Disp: 90 tablet, Rfl: 1   letrozole  (FEMARA ) 2.5 MG tablet, Take 1 tablet (2.5 mg total) by mouth daily. (Patient not taking: Reported on 10/31/2024), Disp: 30 tablet, Rfl: 3   metroNIDAZOLE  (FLAGYL ) 500 MG tablet, Take 1 tablet (500 mg total) by mouth 2 (two) times daily for 7 days (Patient not taking: Reported on 04/29/2024), Disp: 14 tablet, Rfl: 0   Multiple Vitamins-Minerals (EMERGEN-C IMMUNE PO), Take 1 packet by mouth daily., Disp: , Rfl:   Current Facility-Administered Medications:    betamethasone  acetate-betamethasone  sodium phosphate  (CELESTONE ) injection 3 mg, 3 mg, Intra-articular, Once, Janit, Thresa HERO,  DPM  "

## 2024-10-31 NOTE — Progress Notes (Signed)
 Patient doing good; no new or acute concerns at this time.

## 2024-11-04 ENCOUNTER — Other Ambulatory Visit: Payer: Self-pay | Admitting: Oncology

## 2024-11-04 DIAGNOSIS — Z08 Encounter for follow-up examination after completed treatment for malignant neoplasm: Secondary | ICD-10-CM

## 2024-11-10 MED FILL — Tamoxifen Citrate Tab 20 MG (Base Equivalent): ORAL | 30 days supply | Qty: 30 | Fill #1 | Status: AC

## 2025-01-13 ENCOUNTER — Encounter

## 2025-05-08 ENCOUNTER — Inpatient Hospital Stay: Admitting: Oncology
